# Patient Record
Sex: Male | Born: 1980 | Race: White | Hispanic: No | Smoking: Current every day smoker
Health system: Southern US, Community
[De-identification: ages and names within clinical notes are randomized; demographics above are authoritative.]

---

## 2009-04-14 ENCOUNTER — Emergency Department (HOSPITAL_COMMUNITY): Admission: EM | Admit: 2009-04-14 | Discharge: 2009-04-14 | Payer: Self-pay | Admitting: Emergency Medicine

## 2010-05-01 ENCOUNTER — Emergency Department (HOSPITAL_COMMUNITY): Admission: EM | Admit: 2010-05-01 | Discharge: 2010-05-01 | Payer: Self-pay | Admitting: Emergency Medicine

## 2011-11-25 ENCOUNTER — Encounter (HOSPITAL_COMMUNITY): Payer: Self-pay | Admitting: *Deleted

## 2011-11-25 ENCOUNTER — Emergency Department (HOSPITAL_COMMUNITY)
Admission: EM | Admit: 2011-11-25 | Discharge: 2011-11-25 | Disposition: A | Discharge status: Home / Self Care | Payer: Self-pay | Attending: Emergency Medicine | Admitting: Emergency Medicine

## 2011-11-25 ENCOUNTER — Emergency Department (HOSPITAL_COMMUNITY): Payer: Self-pay

## 2011-11-25 DIAGNOSIS — M79609 Pain in unspecified limb: Secondary | ICD-10-CM | POA: Insufficient documentation

## 2011-11-25 DIAGNOSIS — S0510XA Contusion of eyeball and orbital tissues, unspecified eye, initial encounter: Secondary | ICD-10-CM | POA: Insufficient documentation

## 2011-11-25 DIAGNOSIS — R404 Transient alteration of awareness: Secondary | ICD-10-CM | POA: Insufficient documentation

## 2011-11-25 DIAGNOSIS — R220 Localized swelling, mass and lump, head: Secondary | ICD-10-CM | POA: Insufficient documentation

## 2011-11-25 DIAGNOSIS — S8391XA Sprain of unspecified site of right knee, initial encounter: Secondary | ICD-10-CM

## 2011-11-25 DIAGNOSIS — M25569 Pain in unspecified knee: Secondary | ICD-10-CM | POA: Insufficient documentation

## 2011-11-25 DIAGNOSIS — S0083XA Contusion of other part of head, initial encounter: Secondary | ICD-10-CM

## 2011-11-25 DIAGNOSIS — IMO0002 Reserved for concepts with insufficient information to code with codable children: Secondary | ICD-10-CM | POA: Insufficient documentation

## 2011-11-25 DIAGNOSIS — S60229A Contusion of unspecified hand, initial encounter: Secondary | ICD-10-CM | POA: Insufficient documentation

## 2011-11-25 DIAGNOSIS — R6884 Jaw pain: Secondary | ICD-10-CM | POA: Insufficient documentation

## 2011-11-25 DIAGNOSIS — S0003XA Contusion of scalp, initial encounter: Secondary | ICD-10-CM | POA: Insufficient documentation

## 2011-11-25 DIAGNOSIS — M7989 Other specified soft tissue disorders: Secondary | ICD-10-CM | POA: Insufficient documentation

## 2011-11-25 MED ORDER — ONDANSETRON 4 MG PO TBDP
4.0000 mg | ORAL_TABLET | Freq: Once | ORAL | Status: AC
  Administered 2011-11-25: 4 mg via ORAL
  Filled 2011-11-25: qty 1

## 2011-11-25 MED ORDER — HYDROMORPHONE HCL PF 1 MG/ML IJ SOLN
1.0000 mg | Freq: Once | INTRAMUSCULAR | Status: AC
  Administered 2011-11-25: 1 mg via INTRAMUSCULAR
  Filled 2011-11-25: qty 1

## 2011-11-25 MED ORDER — HYDROCODONE-ACETAMINOPHEN 5-325 MG PO TABS: ORAL_TABLET | ORAL | Status: DC
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
## 2011-11-25 NOTE — ED Notes (Signed)
Rick Miller, PA at bedside. 
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
## 2011-11-25 NOTE — ED Notes (Signed)
Patient with no complaints at this time. Respirations even and unlabored. Skin warm/dry. Discharge instructions reviewed with patient at this time. Patient given opportunity to voice concerns/ask questions. Patient discharged at this time and left Emergency Department with steady gait.   
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
## 2011-11-29 NOTE — ED Provider Notes (Signed)
Medical screening examination/treatment/procedure(s) were performed by non-physician practitioner and as supervising physician I was immediately available for consultation/collaboration.   Damarion Mendizabal, MD 11/29/11 1335 

## 2012-07-21 ENCOUNTER — Encounter (HOSPITAL_COMMUNITY): Payer: Self-pay

## 2012-07-21 ENCOUNTER — Emergency Department (HOSPITAL_COMMUNITY)
Admission: EM | Admit: 2012-07-21 | Discharge: 2012-07-21 | Disposition: A | Discharge status: Home / Self Care | Payer: Self-pay | Attending: Emergency Medicine | Admitting: Emergency Medicine

## 2012-07-21 DIAGNOSIS — Z23 Encounter for immunization: Secondary | ICD-10-CM | POA: Insufficient documentation

## 2012-07-21 DIAGNOSIS — F17200 Nicotine dependence, unspecified, uncomplicated: Secondary | ICD-10-CM | POA: Insufficient documentation

## 2012-07-21 DIAGNOSIS — Y9389 Activity, other specified: Secondary | ICD-10-CM | POA: Insufficient documentation

## 2012-07-21 DIAGNOSIS — S61459A Open bite of unspecified hand, initial encounter: Secondary | ICD-10-CM

## 2012-07-21 DIAGNOSIS — W540XXA Bitten by dog, initial encounter: Secondary | ICD-10-CM | POA: Insufficient documentation

## 2012-07-21 DIAGNOSIS — Y92009 Unspecified place in unspecified non-institutional (private) residence as the place of occurrence of the external cause: Secondary | ICD-10-CM | POA: Insufficient documentation

## 2012-07-21 DIAGNOSIS — S61409A Unspecified open wound of unspecified hand, initial encounter: Secondary | ICD-10-CM | POA: Insufficient documentation

## 2012-07-21 MED ORDER — TETANUS-DIPHTH-ACELL PERTUSSIS 5-2.5-18.5 LF-MCG/0.5 IM SUSP
0.5000 mL | Freq: Once | INTRAMUSCULAR | Status: AC
  Administered 2012-07-21: 0.5 mL via INTRAMUSCULAR
  Filled 2012-07-21: qty 0.5

## 2012-07-21 MED ORDER — OXYCODONE-ACETAMINOPHEN 5-325 MG PO TABS: 1.0000 | ORAL_TABLET | ORAL | Status: DC | PRN

## 2012-07-21 MED ORDER — AMOXICILLIN-POT CLAVULANATE 875-125 MG PO TABS
1.0000 | ORAL_TABLET | Freq: Once | ORAL | Status: AC
  Administered 2012-07-21: 1 via ORAL
  Filled 2012-07-21: qty 1

## 2012-07-21 MED ORDER — AMOXICILLIN-POT CLAVULANATE 875-125 MG PO TABS: 1.0000 | ORAL_TABLET | Freq: Two times a day (BID) | ORAL | Status: DC

## 2012-07-21 MED ORDER — BACITRACIN 500 UNIT/GM EX OINT
1.0000 "application " | TOPICAL_OINTMENT | Freq: Two times a day (BID) | CUTANEOUS | Status: DC
  Administered 2012-07-21: 1 via TOPICAL
  Filled 2012-07-21 (×4): qty 0.9

## 2012-07-21 MED ORDER — OXYCODONE-ACETAMINOPHEN 5-325 MG PO TABS: 1.0000 | ORAL_TABLET | ORAL | Status: AC | PRN

## 2012-07-21 MED ORDER — BACITRACIN ZINC 500 UNIT/GM EX OINT
TOPICAL_OINTMENT | CUTANEOUS | Status: AC
  Filled 2012-07-21: qty 1.8

## 2012-07-21 NOTE — ED Notes (Signed)
[facility] at bedside with pt
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
## 2012-07-21 NOTE — ED Notes (Signed)
Pt alert & oriented x4, stable gait. Patient given discharge instructions, paperwork & prescription(s). Patient  instructed to stop at the registration desk to finish any additional paperwork. Patient verbalized understanding. Pt left department w/ no further questions. 
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
## 2012-07-22 NOTE — ED Provider Notes (Signed)
Medical screening examination/treatment/procedure(s) were performed by non-physician practitioner and as supervising physician I was immediately available for consultation/collaboration.   Benny Lennert, MD 07/22/12 [phone]

## 2012-07-23 ENCOUNTER — Emergency Department (HOSPITAL_COMMUNITY): Payer: Self-pay

## 2012-07-23 ENCOUNTER — Encounter (HOSPITAL_COMMUNITY): Payer: Self-pay | Admitting: *Deleted

## 2012-07-23 ENCOUNTER — Emergency Department (HOSPITAL_COMMUNITY)
Admission: EM | Admit: 2012-07-23 | Discharge: 2012-07-23 | Disposition: A | Discharge status: Home / Self Care | Payer: Self-pay | Attending: Emergency Medicine | Admitting: Emergency Medicine

## 2012-07-23 DIAGNOSIS — Z79899 Other long term (current) drug therapy: Secondary | ICD-10-CM | POA: Insufficient documentation

## 2012-07-23 DIAGNOSIS — Y939 Activity, unspecified: Secondary | ICD-10-CM | POA: Insufficient documentation

## 2012-07-23 DIAGNOSIS — W540XXA Bitten by dog, initial encounter: Secondary | ICD-10-CM | POA: Insufficient documentation

## 2012-07-23 DIAGNOSIS — F17200 Nicotine dependence, unspecified, uncomplicated: Secondary | ICD-10-CM | POA: Insufficient documentation

## 2012-07-23 DIAGNOSIS — L089 Local infection of the skin and subcutaneous tissue, unspecified: Secondary | ICD-10-CM

## 2012-07-23 DIAGNOSIS — S61409A Unspecified open wound of unspecified hand, initial encounter: Secondary | ICD-10-CM | POA: Insufficient documentation

## 2012-07-23 DIAGNOSIS — Y929 Unspecified place or not applicable: Secondary | ICD-10-CM | POA: Insufficient documentation

## 2012-07-23 LAB — COMPREHENSIVE METABOLIC PANEL
ALT: 12 U/L (ref 0–53)
AST: 14 U/L (ref 0–37)
Albumin: 3.3 g/dL — ABNORMAL LOW (ref 3.5–5.2)
BUN: 16 mg/dL (ref 6–23)
Calcium: 8.9 mg/dL (ref 8.4–10.5)
Creatinine, Ser: 0.99 mg/dL (ref 0.50–1.35)

## 2012-07-23 LAB — CBC WITH DIFFERENTIAL/PLATELET
Basophils Absolute: 0 10*3/uL (ref 0.0–0.1)
Basophils Relative: 0 % (ref 0–1)
HCT: 41 % (ref 39.0–52.0)
Hemoglobin: 13.9 g/dL (ref 13.0–17.0)
MCH: 30.2 pg (ref 26.0–34.0)
MCHC: 33.9 g/dL (ref 30.0–36.0)
Neutro Abs: 6.2 10*3/uL (ref 1.7–7.7)
Platelets: 182 10*3/uL (ref 150–400)
RDW: 12.5 % (ref 11.5–15.5)

## 2012-07-23 MED ORDER — MORPHINE SULFATE 4 MG/ML IJ SOLN
4.0000 mg | Freq: Once | INTRAMUSCULAR | Status: AC
  Administered 2012-07-23: 4 mg via INTRAVENOUS
  Filled 2012-07-23: qty 1

## 2012-07-23 MED ORDER — CEFTRIAXONE SODIUM 1 G IV SOLR: 1.0000 g | INTRAVENOUS | Status: AC

## 2012-07-23 MED ORDER — METRONIDAZOLE 500 MG PO TABS: 500.0000 mg | ORAL_TABLET | Freq: Three times a day (TID) | ORAL | Status: DC

## 2012-07-23 MED ORDER — METRONIDAZOLE IN NACL 5-0.79 MG/ML-% IV SOLN
500.0000 mg | Freq: Once | INTRAVENOUS | Status: AC
  Administered 2012-07-23: 500 mg via INTRAVENOUS
  Filled 2012-07-23: qty 100

## 2012-07-23 MED ORDER — SODIUM CHLORIDE 0.9 % IV SOLN
INTRAVENOUS | Status: DC
  Administered 2012-07-23: 16:00:00 via INTRAVENOUS

## 2012-07-23 MED ORDER — DEXTROSE 5 % IV SOLN
1.0000 g | Freq: Once | INTRAVENOUS | Status: AC
  Administered 2012-07-23: 1 g via INTRAVENOUS
  Filled 2012-07-23: qty 10

## 2012-07-23 MED ORDER — ONDANSETRON HCL 4 MG/2ML IJ SOLN
4.0000 mg | Freq: Once | INTRAMUSCULAR | Status: AC
  Administered 2012-07-23: 4 mg via INTRAVENOUS
  Filled 2012-07-23: qty 2

## 2012-07-23 NOTE — ED Notes (Signed)
Patient cursing at staff and stating he was going to leave andf that he was here at 2 and still he was here.
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
## 2012-07-23 NOTE — ED Notes (Signed)
Patient states he is ready to be discharged at this time.

## 2012-07-23 NOTE — ED Notes (Addendum)
For recheck of dog bite to lt hand.  Swelling and pain present.  Swelling of middle finger especially.

## 2012-07-25 ENCOUNTER — Encounter (HOSPITAL_COMMUNITY): Payer: Self-pay | Admitting: *Deleted

## 2012-07-25 ENCOUNTER — Emergency Department (HOSPITAL_COMMUNITY)
Admission: EM | Admit: 2012-07-25 | Discharge: 2012-07-25 | Disposition: A | Discharge status: Home / Self Care | Payer: Self-pay | Attending: Emergency Medicine | Admitting: Emergency Medicine

## 2012-07-25 DIAGNOSIS — L089 Local infection of the skin and subcutaneous tissue, unspecified: Secondary | ICD-10-CM

## 2012-07-25 DIAGNOSIS — M79609 Pain in unspecified limb: Secondary | ICD-10-CM | POA: Insufficient documentation

## 2012-07-25 DIAGNOSIS — F17200 Nicotine dependence, unspecified, uncomplicated: Secondary | ICD-10-CM | POA: Insufficient documentation

## 2012-07-25 DIAGNOSIS — Y929 Unspecified place or not applicable: Secondary | ICD-10-CM | POA: Insufficient documentation

## 2012-07-25 DIAGNOSIS — S61409A Unspecified open wound of unspecified hand, initial encounter: Secondary | ICD-10-CM | POA: Insufficient documentation

## 2012-07-25 DIAGNOSIS — Z87828 Personal history of other (healed) physical injury and trauma: Secondary | ICD-10-CM | POA: Insufficient documentation

## 2012-07-25 DIAGNOSIS — S61459A Open bite of unspecified hand, initial encounter: Secondary | ICD-10-CM

## 2012-07-25 DIAGNOSIS — M25539 Pain in unspecified wrist: Secondary | ICD-10-CM | POA: Insufficient documentation

## 2012-07-25 DIAGNOSIS — Y9389 Activity, other specified: Secondary | ICD-10-CM | POA: Insufficient documentation

## 2012-07-25 DIAGNOSIS — W540XXA Bitten by dog, initial encounter: Secondary | ICD-10-CM | POA: Insufficient documentation

## 2012-07-25 MED ORDER — HYDROCODONE-ACETAMINOPHEN 5-325 MG PO TABS: ORAL_TABLET | ORAL | Status: DC

## 2012-07-25 MED ORDER — OXYCODONE-ACETAMINOPHEN 5-325 MG PO TABS
1.0000 | ORAL_TABLET | Freq: Once | ORAL | Status: AC
  Administered 2012-07-25: 1 via ORAL
  Filled 2012-07-25: qty 1

## 2012-07-25 MED ORDER — DEXTROSE 5 % IV SOLN
1.0000 g | Freq: Once | INTRAVENOUS | Status: AC
  Administered 2012-07-25: 1 g via INTRAVENOUS
  Filled 2012-07-25: qty 1

## 2012-07-25 NOTE — ED Provider Notes (Addendum)
History  This chart was scribed for Ward Givens, MD by Madaline Brilliant, ED Scribe. The patient was seen in room APA11/[facility]11. Patient's care was started at 1029.   CSN: [id number]  Arrival date & time 07/25/12  1002   First MD Initiated Contact with Patient 07/25/12 1029      Chief Complaint  Patient presents with  . Wound Check    The history is provided by the patient. No language interpreter was used.   Trevor Hodges is a 32 y.o. male presents to the Emergency Department complaining of moderate, worsening left-hand pain between his ring finger and middle finger after a dog bite  4-5 days ago that was treated with augmentin. Pt was seen in ED 2 days ago for infection in the  dog bite to left hand and was given IV antibiotics (rocephin) and a prescription for flagyl. He was discussed with Dr Romeo Apple. Patient was to return the following day to get IV antibiotics in the specialty clinic and was to see Dr. Romeo Apple in the ED today for recheck. He relates when he went to the specialty clinic yesterday they arranged for him to get home health antibiotic treatment. He states it was delivered last night at 10 PM. He has not gotten the Flagyl filled yet. They state they're going to do it today. So basically patient has had no antibiotics since he was seen in the ED 2 days ago except for the Augmentin he was artery on that was not improving his symptoms. He states that pain today in his left hand is worse than previous pain. Pt has not had any abx since discharge from ED 2 days ago. Pt reports that at baseline cannot straighten the end of his middle finger due to prior dislocation.   PCP None   History reviewed. No pertinent past medical history.  History reviewed. No pertinent past surgical history.  No family history on file.  History  Substance Use Topics  . Smoking status: Current Every Day Smoker  . Smokeless tobacco: Not on file  . Alcohol Use: Yes     Comment: occassional     Unemployed Lives at home Lives with spouse   Review of Systems  Musculoskeletal: Positive for arthralgias (left ring finger and middle finger).  All other systems reviewed and are negative.    Allergies  Toradol and Ultram  Home Medications   Current Outpatient Rx  Name  Route  Sig  Dispense  Refill  . AMOXICILLIN-POT CLAVULANATE 875-125 MG PO TABS   Oral   Take 1 tablet by mouth 2 (two) times daily.   20 tablet   0   . GOODY HEADACHE PO   Oral   Take 1 Package by mouth daily as needed. Headache.         . CEFTRIAXONE SODIUM 1 G IV SOLR   Intravenous   Inject 1 g into the vein daily.   10 each   0     TO GET IN SPECIALTY CLINIC AT [facility] DAILY   . METRONIDAZOLE 500 MG PO TABS   Oral   Take 1 tablet (500 mg total) by mouth 3 (three) times daily.   30 tablet   0   . OXYCODONE-ACETAMINOPHEN 5-325 MG PO TABS   Oral   Take 1 tablet by mouth every 4 (four) hours as needed for pain.   6 tablet   0     Dispense one pre-pack for home use     Triage  vitals:  BP 118/65  Pulse 81  Temp 97.7 F (36.5 C) (Oral)  Resp 18  Ht 6' (1.829 m)  Wt 185 lb (83.915 kg)  BMI 25.09 kg/m2  SpO2 98%  Vital signs normal    Physical Exam  Nursing note and vitals reviewed. Constitutional: He is oriented to person, place, and time. He appears well-developed and well-nourished.  Non-toxic appearance. He does not appear ill. No distress.  HENT:  Head: Normocephalic and atraumatic.  Right Ear: External ear normal.  Left Ear: External ear normal.  Nose: Nose normal. No mucosal edema or rhinorrhea.  Mouth/Throat: Oropharynx is clear and moist and mucous membranes are normal. No dental abscesses or uvula swelling.  Eyes: Conjunctivae normal and EOM are normal. Pupils are equal, round, and reactive to light.  Neck: Normal range of motion and full passive range of motion without pain. Neck supple.  Cardiovascular: Normal rate, regular rhythm and normal heart sounds.  Exam  reveals no gallop and no friction rub.   No murmur heard. Pulmonary/Chest: Effort normal and breath sounds normal. No respiratory distress. He has no wheezes. He has no rhonchi. He has no rales. He exhibits no tenderness and no crepitus.  Abdominal: Soft. Normal appearance and bowel sounds are normal. He exhibits no distension. There is no tenderness. There is no rebound and no guarding.  Musculoskeletal: Normal range of motion. He exhibits no edema and no tenderness.          Laceration on dorsum of hand between metacarpals of  ring and middle finger with increased redness from last visit. There is more swelling of dorsum of left hand up to the wrist and mild red streaking of radial aspect of the  Forearm. Has intact extension, can flex but limited b/o swelling.   Puncture wound on left palm that is 1 cm proximal to left index finger MCP crease. Superficial abrasions of the left index finger over radial aspect of PIP joint. Linear abrasion over MCP joint of left index. There are a  couple of superficial abrasions of web space between thumb and index fingers of left hand. Small abrasion radial to MCP of left index finger.  There is a semicirvular laceration proximal to space between MCP of ring and middle finger with small puncture abrasions (about 4) proximal to it. Diffuse swelling and redness to dorsum of left hand. Swelling of left middle, ring, and index fingers. Pt has difficulty making a fist mainly from the swelling in the dorsum of his left hand. Has minimal discomfort to extension of his fingers.  Palm of right hand has linear abrasion in web space between thumb and index finger. Small single puncture wound ulnar to proximal metatarsal of right thumb.     Neurological: He is alert and oriented to person, place, and time. He has normal strength. No cranial nerve deficit.  Skin: Skin is warm, dry and intact. No rash noted. No erythema. No pallor.  Psychiatric: He has a normal mood and  affect. His speech is normal and behavior is normal. His mood appears not anxious.    ED Course  Procedures (including critical care time)   Medications  cefTAZidime (FORTAZ) 1 g in dextrose 5 % 50 mL IVPB (0 g Intravenous Stopped 07/25/12 1244)  Ordered by Dr Romeo Apple  DIAGNOSTIC STUDIES: Oxygen Saturation is 98% on room, normal by my interpretation.    COORDINATION OF CARE: 1058 Schedule of antibiotics is being reviewed. Patient informed of current plan for treatment and evaluation and  agrees with plan at this time. :  11:10 Dr Romeo Apple will see in the ED  Dr Romeo Apple has seen patient in the ED  1139 Recheck: Pt is ready for discharge    1. Dog bite of hand   2. Infected hand      New Prescriptions   HYDROCODONE-ACETAMINOPHEN (NORCO/VICODIN) 5-325 MG PER TABLET    Take 1 or 2 po Q 6hrs for pain   Plan discharge  Devoria Albe, MD, FACEP      MDM    I personally performed the services described in this documentation, which was scribed in my presence. The recorded information has been reviewed and considered.  Devoria Albe, MD, FACEP       Ward Givens, MD 07/25/12 1401  Ward Givens, MD 07/25/12 [phone]

## 2012-07-25 NOTE — Progress Notes (Addendum)
Patient ID: Trevor Hodges, male   DOB: 01-10-1981, 32 y.o.   MRN: [id number] Left hand cellulitis  DID NOT GET ANTIBIOTIC LAST 2 DAYS   MEDICAL STAFF UNAWARE [facility] IS HANDLING OUT PATIENT IV   CALLED [facility] TO CONFIRM IV ROCEPHIN FOR THIS PATIENT X 10 DAYS   DORSAL HAND SWELLING SOME ERYTHEMA, PAIN, TENDERNESS,  NO ABSCESS.  REC IV ROCEPHIN [HE HAS SOME ORAL MEDS ?????]  F/U IN 48 HRS IN ER. THEN WITH DR HARRISON ON Thursday AT 130 PM

## 2012-07-25 NOTE — ED Notes (Signed)
C/o left hand pain, worsening since onset 4 days ago; out of pain medication.

## 2012-07-28 MED FILL — Oxycodone w/ Acetaminophen Tab 5-325 MG: ORAL | Qty: 6 | Status: AC

## 2012-07-31 ENCOUNTER — Ambulatory Visit (INDEPENDENT_AMBULATORY_CARE_PROVIDER_SITE_OTHER): Payer: Self-pay | Admitting: Orthopedic Surgery

## 2012-07-31 ENCOUNTER — Encounter: Payer: Self-pay | Admitting: Orthopedic Surgery

## 2012-07-31 VITALS — BP 110/78 | Ht 72.0 in | Wt 185.0 lb

## 2012-07-31 DIAGNOSIS — S61409A Unspecified open wound of unspecified hand, initial encounter: Secondary | ICD-10-CM

## 2012-07-31 DIAGNOSIS — S61459A Open bite of unspecified hand, initial encounter: Secondary | ICD-10-CM | POA: Insufficient documentation

## 2012-07-31 DIAGNOSIS — W540XXA Bitten by dog, initial encounter: Secondary | ICD-10-CM

## 2012-07-31 NOTE — Progress Notes (Signed)
Patient ID: Trevor Hodges, male   DOB: 04/12/1981, 32 y.o.   MRN: [id number] Chief Complaint  Patient presents with  . Hand Problem    Dog bite left hand   Right-handed 32 year old male was bitten by a dog over a week ago he was seen in emergency room on 2 occasions and eventually started on IV antibiotics with Rocephin once a day. Over the last 7 days of IV antibiotics his swelling has gone down his pain is increased in his range of motion is gotten better he complains of mild discomfort over his left hand primarily involving the ring finger. He has 2 lacerations one in between the ring and long finger and one closer to the index finger neither of them are over the joint there is no erythema swelling loss of motion or streaking  Review of systems cough nausea  Medical history as described  Exam   VS BP 110/78  Ht 6' (1.829 m)  Wt 185 lb (83.915 kg)  BMI 25.09 kg/m2   General appearance is normal grooming and hygiene  Oriented x3  Mood and affect is normal  Cardiovascular exam is normal  Sensation is normal  Coordination and balance normal  No pathologic reflexes  Ambulation  is without support or limp  Inspection and palpation  left hand has 2 lacerations as described they are healing there is no swelling range of motion is full stability of the joints are normal grip strength is slightly diminished from the injury and pain  Medical decision making  Continue IV antibiotics for dog bite with hand infection  Followup in one week continue current treatment

## 2012-07-31 NOTE — Patient Instructions (Signed)
Continue iv antibiotics

## 2012-08-07 ENCOUNTER — Encounter: Payer: Self-pay | Admitting: Orthopedic Surgery

## 2012-08-07 ENCOUNTER — Ambulatory Visit: Payer: Self-pay | Admitting: Orthopedic Surgery

## 2012-09-10 ENCOUNTER — Encounter (HOSPITAL_COMMUNITY): Payer: Self-pay

## 2012-09-10 ENCOUNTER — Emergency Department (HOSPITAL_COMMUNITY)
Admission: EM | Admit: 2012-09-10 | Discharge: 2012-09-10 | Disposition: A | Discharge status: Home / Self Care | Payer: Self-pay | Attending: Emergency Medicine | Admitting: Emergency Medicine

## 2012-09-10 ENCOUNTER — Emergency Department (HOSPITAL_COMMUNITY): Payer: Self-pay

## 2012-09-10 DIAGNOSIS — W010XXA Fall on same level from slipping, tripping and stumbling without subsequent striking against object, initial encounter: Secondary | ICD-10-CM | POA: Insufficient documentation

## 2012-09-10 DIAGNOSIS — Y9289 Other specified places as the place of occurrence of the external cause: Secondary | ICD-10-CM | POA: Insufficient documentation

## 2012-09-10 DIAGNOSIS — M25561 Pain in right knee: Secondary | ICD-10-CM

## 2012-09-10 DIAGNOSIS — F17200 Nicotine dependence, unspecified, uncomplicated: Secondary | ICD-10-CM | POA: Insufficient documentation

## 2012-09-10 DIAGNOSIS — S8990XA Unspecified injury of unspecified lower leg, initial encounter: Secondary | ICD-10-CM | POA: Insufficient documentation

## 2012-09-10 DIAGNOSIS — S99919A Unspecified injury of unspecified ankle, initial encounter: Secondary | ICD-10-CM | POA: Insufficient documentation

## 2012-09-10 DIAGNOSIS — Y939 Activity, unspecified: Secondary | ICD-10-CM | POA: Insufficient documentation

## 2012-09-10 DIAGNOSIS — G8929 Other chronic pain: Secondary | ICD-10-CM | POA: Insufficient documentation

## 2012-09-10 MED ORDER — HYDROCODONE-ACETAMINOPHEN 5-325 MG PO TABS: 1.0000 | ORAL_TABLET | ORAL | Status: DC | PRN

## 2012-09-10 NOTE — ED Notes (Signed)
Pt reports has chronic r knee pain from injury years ago.  Reports pain has been more intense recently.

## 2012-09-10 NOTE — ED Notes (Signed)
Pt presents with rt knee pain, states has  chronic knee pain, with worsening pain since yesterday. NAD noted.

## 2012-09-10 NOTE — ED Notes (Signed)
Pt presents to Ed secondary to chronic knee pain, worsening "today after a fall on the ice".  No swelling, bruising, abrasion or deformity noted. Pt is noted slurring his words, has excessive oral secretions at the corners of his mouth and dilated pupils. Pt kept repeating he did not want any trouble, just wants his knee fixed. NAD noted.

## 2012-09-11 NOTE — ED Provider Notes (Signed)
History     CSN: [id number]  Arrival date & time 09/10/12  1126   First MD Initiated Contact with Patient 09/10/12 1137      Chief Complaint  Patient presents with  . Knee Pain    (Consider location/radiation/quality/duration/timing/severity/associated sxs/prior treatment) HPI Comments: Trevor Hodges is a 32 y.o. Male presenting with acute on chronic right knee pain after tripping this morning, and hitting his knee against the edge of a step.  He has chronic but slowly worsening pain in this knee since he had a fall about 5 years ago which caused acute knee trauma and large laceration to the inferior anterior knee.  At the time,  He was referred to orthopedics but never followed up with the specialist.  He denies weakness in the knee and there is no radiation of pain and no numbness or weakness distal to the injury site.  He has taken no medicines prior to arrival today.  Pain is worse with palpation, flexing the knee and weight bearing,  Although he was able to ambulate upon arrival.     The history is provided by the patient.    History reviewed. No pertinent past medical history.  History reviewed. No pertinent past surgical history.  No family history on file.  History  Substance Use Topics  . Smoking status: Current Every Day Smoker  . Smokeless tobacco: Not on file  . Alcohol Use: Yes     Comment: occassional       Review of Systems  Musculoskeletal: Positive for joint swelling and arthralgias.  Skin: Negative for wound.  Neurological: Negative for weakness and numbness.    Allergies  Toradol and Ultram  Home Medications   Current Outpatient Rx  Name  Route  Sig  Dispense  Refill  . HYDROcodone-acetaminophen (NORCO/VICODIN) 5-325 MG per tablet   Oral   Take 1 tablet by mouth every 4 (four) hours as needed for pain.   15 tablet   0     BP 121/71  Pulse 91  Temp(Src) 97.7 F (36.5 C) (Oral)  Resp 18  Ht 6' (1.829 m)  Wt 180 lb (81.647 kg)  BMI  24.41 kg/m2  SpO2 99%  Physical Exam  Constitutional: He appears well-developed and well-nourished.  HENT:  Head: Atraumatic.  Neck: Normal range of motion.  Cardiovascular:  Pulses equal bilaterally  Musculoskeletal: He exhibits tenderness. He exhibits no edema.       Right knee: He exhibits decreased range of motion and swelling. He exhibits no effusion, no ecchymosis, no deformity, no erythema, no LCL laxity, normal patellar mobility and no MCL laxity. Tenderness found. Medial joint line and lateral joint line tenderness noted.  TTP along medial and lateral anterior tibial plateau,  Modest crepitus with ROM.  No effusion,  No edema appreciated.  He does have a small,  Mobile subcutaneous hard nodule lateral knee joint space (states it's a retained rock from previous injury).  Neurological: He is alert. He has normal strength. He displays normal reflexes. No sensory deficit.  Equal strength  Skin: Skin is warm and dry.  Psychiatric: He has a normal mood and affect.    ED Course  Procedures (including critical care time)  Labs Reviewed - No data to display Dg Knee Complete 4 Views Right  09/10/2012  *RADIOLOGY REPORT*  Clinical Data: Worsening chronic right knee pain post fall  RIGHT KNEE - COMPLETE 4+ VIEW  Comparison: 11/25/2011  Findings: Small calcified ossicle lateral to the distal tibial metaphysis  unchanged. Osseous mineralization normal. Joint spaces preserved. No acute fracture, dislocation, or bone destruction. No knee joint effusion.  IMPRESSION: No acute osseous abnormalities. No interval change.   Original Report Authenticated By: Ulyses Southward, M.D.      1. Chronic knee pain, right       MDM  Acute on chronic right knee pain with no acute findings on exam and per xray,  Which was reviewed.  Ace wrap provided,  Crutches offered but refused.  Hydrocodone short course supplied. Ice recommended for the next 2 days (ice pack also given in ed).  Referral to ortho for definitive  tx of acute on chronic knee pain.  [place] controlled substance database reviewed with no pattern of abuse found.   The patient appears reasonably screened and/or stabilized for discharge and I doubt any other medical condition or other [facility] requiring further screening, evaluation, or treatment in the ED at this time prior to discharge.         Burgess Amor, PA-C 09/11/12 1140  Burgess Amor, PA-C 09/11/12 1141

## 2012-09-13 NOTE — ED Provider Notes (Signed)
History/physical exam/procedure(s) were performed by non-physician practitioner and as supervising physician I was immediately available for consultation/collaboration. I have reviewed all notes and am in agreement with care and plan.   Hilario Quarry, MD 09/13/12 [phone]

## 2012-10-08 ENCOUNTER — Emergency Department (HOSPITAL_COMMUNITY)
Admission: EM | Admit: 2012-10-08 | Discharge: 2012-10-08 | Disposition: A | Discharge status: Home / Self Care | Payer: Self-pay | Attending: Emergency Medicine | Admitting: Emergency Medicine

## 2012-10-08 ENCOUNTER — Encounter (HOSPITAL_COMMUNITY): Payer: Self-pay | Admitting: Emergency Medicine

## 2012-10-08 ENCOUNTER — Emergency Department (HOSPITAL_COMMUNITY): Payer: Self-pay

## 2012-10-08 DIAGNOSIS — W11XXXA Fall on and from ladder, initial encounter: Secondary | ICD-10-CM | POA: Insufficient documentation

## 2012-10-08 DIAGNOSIS — Y9389 Activity, other specified: Secondary | ICD-10-CM | POA: Insufficient documentation

## 2012-10-08 DIAGNOSIS — S86911A Strain of unspecified muscle(s) and tendon(s) at lower leg level, right leg, initial encounter: Secondary | ICD-10-CM

## 2012-10-08 DIAGNOSIS — IMO0002 Reserved for concepts with insufficient information to code with codable children: Secondary | ICD-10-CM | POA: Insufficient documentation

## 2012-10-08 DIAGNOSIS — Y92009 Unspecified place in unspecified non-institutional (private) residence as the place of occurrence of the external cause: Secondary | ICD-10-CM | POA: Insufficient documentation

## 2012-10-08 DIAGNOSIS — F17200 Nicotine dependence, unspecified, uncomplicated: Secondary | ICD-10-CM | POA: Insufficient documentation

## 2012-10-08 MED ORDER — MELOXICAM 7.5 MG PO TABS: 7.5000 mg | ORAL_TABLET | Freq: Every day | ORAL | Status: DC

## 2012-10-08 NOTE — ED Notes (Signed)
Jumped off ladder and felt knee impact with severe pain

## 2012-10-08 NOTE — ED Provider Notes (Signed)
History     CSN: [id number]  Arrival date & time 10/08/12  1343   First MD Initiated Contact with Patient 10/08/12 1359      Chief Complaint  Patient presents with  . Knee Pain    (Consider location/radiation/quality/duration/timing/severity/associated sxs/prior treatment) HPI Comments: Trevor Hodges is a 32 y.o. Male presenting with right knee pain after he jumped off a ladder from the 8th rung as it was tipping,  He landed on his feet but then fell to the side, landing on his right lateral knee.  He has chronic pain in the knee at baseline secondary to injury when he was a teenager with a retained piece of stone along the lateral knee which intermittently causes pain.  He has taken no medicines before arrival here.  He is wearing a knee brace which he reports wearing often when working.  There is no radiation of pain which is constant and aching. Movement and palpation makes worse.    The history is provided by the patient.    History reviewed. No pertinent past medical history.  History reviewed. No pertinent past surgical history.  History reviewed. No pertinent family history.  History  Substance Use Topics  . Smoking status: Current Every Day Smoker  . Smokeless tobacco: Not on file  . Alcohol Use: Yes     Comment: occassional       Review of Systems  Constitutional: Negative for fever.  Musculoskeletal: Positive for joint swelling and arthralgias. Negative for myalgias.  Neurological: Negative for weakness and numbness.    Allergies  Toradol and Ultram  Home Medications   Current Outpatient Rx  Name  Route  Sig  Dispense  Refill  . ibuprofen (ADVIL,MOTRIN) 200 MG tablet   Oral   Take 400 mg by mouth 2 (two) times daily as needed for pain.         . meloxicam (MOBIC) 7.5 MG tablet   Oral   Take 1 tablet (7.5 mg total) by mouth daily.   15 tablet   0     BP 115/61  Pulse 94  Temp(Src) 98.2 F (36.8 C) (Oral)  Resp 18  Ht 6' (1.829 m)  Wt  180 lb (81.647 kg)  BMI 24.41 kg/m2  SpO2 98%  Physical Exam  Constitutional: He appears well-developed and well-nourished.  HENT:  Head: Atraumatic.  Neck: Normal range of motion.  Cardiovascular:  Pulses equal bilaterally  Musculoskeletal: He exhibits tenderness. He exhibits no edema.       Right knee: He exhibits no swelling, no effusion, no deformity, normal alignment, no LCL laxity, normal meniscus and no MCL laxity. Tenderness found. Medial joint line and lateral joint line tenderness noted.  Old well healed laceration scar across inferior patella.  Small mobile fb right lateral knee space,  Non tender.    Neurological: He is alert. He has normal strength. He displays normal reflexes. No sensory deficit.  Equal strength  Skin: Skin is warm and dry.  Psychiatric: He has a normal mood and affect.    ED Course  Procedures (including critical care time)  Labs Reviewed - No data to display Dg Knee Complete 4 Views Right  10/08/2012  *RADIOLOGY REPORT*  Clinical Data: Injured right knee.  RIGHT KNEE - COMPLETE 4+ VIEW  Comparison: 09/10/2012.  Findings: The joint spaces are maintained.  No acute fractures identified.  No osteochondral lesion.  No joint effusion.  Stable calcification noted just lateral to the patella.  IMPRESSION: No acute bony  findings.   Original Report Authenticated By: Rudie Meyer, M.D.      1. Knee strain, right, initial encounter       MDM  Stable knee exam without physical or xray evidence of new injury.  Pt was placed in knee immobilizer and crutches provided. Encouraged ice,  Elevation,  Prescribed meloxicam.  Referral to Dr. Romeo Apple.    This pt was seen by me also on 09/10/12 for another injury to the same knee. He did not followup with the referrals given at that time.        Burgess Amor, PA-C 10/08/12 1634

## 2012-10-12 NOTE — ED Provider Notes (Signed)
Medical screening examination/treatment/procedure(s) were performed by non-physician practitioner and as supervising physician I was immediately available for consultation/collaboration. Tsuruko Murtha, MD, FACEP   Shadawn Hanaway L Naliyah Neth, MD 10/12/12 2244 

## 2012-10-30 ENCOUNTER — Emergency Department (HOSPITAL_COMMUNITY)
Admission: EM | Admit: 2012-10-30 | Discharge: 2012-10-30 | Disposition: A | Discharge status: Home / Self Care | Payer: Self-pay | Attending: Emergency Medicine | Admitting: Emergency Medicine

## 2012-10-30 ENCOUNTER — Encounter (HOSPITAL_COMMUNITY): Payer: Self-pay | Admitting: *Deleted

## 2012-10-30 DIAGNOSIS — Y9289 Other specified places as the place of occurrence of the external cause: Secondary | ICD-10-CM | POA: Insufficient documentation

## 2012-10-30 DIAGNOSIS — Y990 Civilian activity done for income or pay: Secondary | ICD-10-CM | POA: Insufficient documentation

## 2012-10-30 DIAGNOSIS — S81009A Unspecified open wound, unspecified knee, initial encounter: Secondary | ICD-10-CM | POA: Insufficient documentation

## 2012-10-30 DIAGNOSIS — W268XXA Contact with other sharp object(s), not elsewhere classified, initial encounter: Secondary | ICD-10-CM | POA: Insufficient documentation

## 2012-10-30 DIAGNOSIS — F17200 Nicotine dependence, unspecified, uncomplicated: Secondary | ICD-10-CM | POA: Insufficient documentation

## 2012-10-30 DIAGNOSIS — Z87828 Personal history of other (healed) physical injury and trauma: Secondary | ICD-10-CM | POA: Insufficient documentation

## 2012-10-30 DIAGNOSIS — S91009A Unspecified open wound, unspecified ankle, initial encounter: Secondary | ICD-10-CM | POA: Insufficient documentation

## 2012-10-30 DIAGNOSIS — S81812A Laceration without foreign body, left lower leg, initial encounter: Secondary | ICD-10-CM

## 2012-10-30 DIAGNOSIS — Y9389 Activity, other specified: Secondary | ICD-10-CM | POA: Insufficient documentation

## 2012-10-30 MED ORDER — LIDOCAINE-EPINEPHRINE (PF) 1 %-1:200000 IJ SOLN
INTRAMUSCULAR | Status: AC
  Filled 2012-10-30: qty 10
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
## 2012-12-21 ENCOUNTER — Emergency Department (HOSPITAL_COMMUNITY)
Admission: EM | Admit: 2012-12-21 | Discharge: 2012-12-21 | Disposition: A | Discharge status: Home / Self Care | Payer: Medicaid Other | Attending: Emergency Medicine | Admitting: Emergency Medicine

## 2012-12-21 ENCOUNTER — Encounter (HOSPITAL_COMMUNITY): Payer: Self-pay

## 2012-12-21 ENCOUNTER — Emergency Department (HOSPITAL_COMMUNITY): Payer: Medicaid Other

## 2012-12-21 DIAGNOSIS — S93402A Sprain of unspecified ligament of left ankle, initial encounter: Secondary | ICD-10-CM

## 2012-12-21 DIAGNOSIS — X500XXA Overexertion from strenuous movement or load, initial encounter: Secondary | ICD-10-CM | POA: Insufficient documentation

## 2012-12-21 DIAGNOSIS — Y929 Unspecified place or not applicable: Secondary | ICD-10-CM | POA: Insufficient documentation

## 2012-12-21 DIAGNOSIS — S93409A Sprain of unspecified ligament of unspecified ankle, initial encounter: Secondary | ICD-10-CM | POA: Insufficient documentation

## 2012-12-21 DIAGNOSIS — Y9389 Activity, other specified: Secondary | ICD-10-CM | POA: Insufficient documentation

## 2012-12-21 DIAGNOSIS — F17200 Nicotine dependence, unspecified, uncomplicated: Secondary | ICD-10-CM | POA: Insufficient documentation

## 2012-12-21 MED ORDER — ACETAMINOPHEN-CODEINE #3 300-30 MG PO TABS
1.0000 | ORAL_TABLET | Freq: Once | ORAL | Status: AC
  Administered 2012-12-21: 1 via ORAL
  Filled 2012-12-21: qty 1

## 2012-12-21 MED ORDER — IBUPROFEN 800 MG PO TABS: 800.0000 mg | ORAL_TABLET | Freq: Three times a day (TID) | ORAL | Status: DC

## 2012-12-21 MED ORDER — ACETAMINOPHEN-CODEINE #3 300-30 MG PO TABS: 1.0000 | ORAL_TABLET | Freq: Four times a day (QID) | ORAL | Status: DC | PRN

## 2012-12-21 MED ORDER — IBUPROFEN 800 MG PO TABS
800.0000 mg | ORAL_TABLET | Freq: Once | ORAL | Status: AC
  Administered 2012-12-21: 800 mg via ORAL
  Filled 2012-12-21: qty 1
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
## 2012-12-23 NOTE — ED Provider Notes (Signed)
Medical screening examination/treatment/procedure(s) were performed by non-physician practitioner and as supervising physician I was immediately available for consultation/collaboration.   Joya Gaskins, MD 12/23/12 [phone]

## 2013-09-02 ENCOUNTER — Ambulatory Visit (INDEPENDENT_AMBULATORY_CARE_PROVIDER_SITE_OTHER): Payer: Medicaid Other | Admitting: Orthopedic Surgery

## 2013-09-02 ENCOUNTER — Ambulatory Visit (INDEPENDENT_AMBULATORY_CARE_PROVIDER_SITE_OTHER): Payer: Medicaid Other

## 2013-09-02 ENCOUNTER — Encounter: Payer: Self-pay | Admitting: Orthopedic Surgery

## 2013-09-02 VITALS — BP 140/82 | Ht 72.0 in | Wt 171.0 lb

## 2013-09-02 DIAGNOSIS — M25569 Pain in unspecified knee: Secondary | ICD-10-CM

## 2013-09-02 DIAGNOSIS — M25561 Pain in right knee: Secondary | ICD-10-CM

## 2013-09-02 DIAGNOSIS — M2241 Chondromalacia patellae, right knee: Secondary | ICD-10-CM | POA: Insufficient documentation

## 2013-09-02 DIAGNOSIS — M2240 Chondromalacia patellae, unspecified knee: Secondary | ICD-10-CM
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
## 2013-09-02 NOTE — Patient Instructions (Addendum)
Chondromalacia Your exam shows your knee pain is likely due to a cartilage swelling and irritation under the knee cap called chondromalacia. The knee cap moves up and down in its groove when you walk, run, or squat. It can become irritated from sports or work activities if the knee cap is not lined up perfectly or your quadriceps muscle is relatively weak. This can cause pain, usually around the knee cap but sometimes the back of the knee. It is most common in young and active people. Climbing stairs, prolonged sitting and rising from a chair will often make the pain worse. Treatment includes rest from activities which make it worse. The pain can be reduced with ice packs and anti-inflammatory pain medicine. Exercises to strengthen the thigh (quadriceps) muscle may help prevent further episodes of this condition. Shoe inserts to correct imbalances in the legs or feet may be prescribed by your doctor or a specialist. Support for the knee cap with a light brace may also be helpful. Call your caregiver if you are not improving after 2 - 3 weeks of treatment.  SEEK MEDICAL CARE IF:  You have increasing pain or your knee becomes hot, swollen, red, or begins to give out or lock up on you. Document Released: 08/02/2004 Document Revised: 09/17/2011 Document Reviewed: 12/21/2008 [facility]Exit[facility] Patient Information 2014 [place]Care, [place]LLC.   [facility]

## 2013-11-04 ENCOUNTER — Ambulatory Visit (HOSPITAL_COMMUNITY)
Admission: RE | Admit: 2013-11-04 | Discharge: 2013-11-04 | Disposition: A | Discharge status: Home / Self Care | Payer: Medicaid Other | Source: Ambulatory Visit | Attending: Orthopedic Surgery | Admitting: Orthopedic Surgery
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
## 2013-11-10 ENCOUNTER — Emergency Department (HOSPITAL_COMMUNITY): Payer: Medicaid Other

## 2013-11-10 ENCOUNTER — Emergency Department (HOSPITAL_COMMUNITY)
Admission: EM | Admit: 2013-11-10 | Discharge: 2013-11-10 | Disposition: A | Discharge status: Home / Self Care | Payer: Medicaid Other | Attending: Emergency Medicine | Admitting: Emergency Medicine

## 2013-11-10 ENCOUNTER — Encounter (HOSPITAL_COMMUNITY): Payer: Self-pay | Admitting: Emergency Medicine

## 2013-11-10 DIAGNOSIS — Y92410 Unspecified street and highway as the place of occurrence of the external cause: Secondary | ICD-10-CM | POA: Insufficient documentation

## 2013-11-10 DIAGNOSIS — F17200 Nicotine dependence, unspecified, uncomplicated: Secondary | ICD-10-CM | POA: Insufficient documentation

## 2013-11-10 DIAGNOSIS — W050XXA Fall from non-moving wheelchair, initial encounter: Secondary | ICD-10-CM | POA: Insufficient documentation

## 2013-11-10 DIAGNOSIS — IMO0002 Reserved for concepts with insufficient information to code with codable children: Secondary | ICD-10-CM | POA: Insufficient documentation

## 2013-11-10 DIAGNOSIS — Z7982 Long term (current) use of aspirin: Secondary | ICD-10-CM | POA: Insufficient documentation

## 2013-11-10 DIAGNOSIS — S80819A Abrasion, unspecified lower leg, initial encounter: Secondary | ICD-10-CM

## 2013-11-10 DIAGNOSIS — Y9355 Activity, bike riding: Secondary | ICD-10-CM | POA: Insufficient documentation

## 2013-11-10 DIAGNOSIS — L089 Local infection of the skin and subcutaneous tissue, unspecified: Secondary | ICD-10-CM

## 2013-11-10 DIAGNOSIS — Z79899 Other long term (current) drug therapy: Secondary | ICD-10-CM | POA: Insufficient documentation

## 2013-11-10 MED ORDER — CEPHALEXIN 500 MG PO CAPS
500.0000 mg | ORAL_CAPSULE | Freq: Once | ORAL | Status: AC
  Administered 2013-11-10: 500 mg via ORAL
  Filled 2013-11-10: qty 1

## 2013-11-10 MED ORDER — BACITRACIN ZINC 500 UNIT/GM EX OINT
TOPICAL_OINTMENT | CUTANEOUS | Status: AC
  Administered 2013-11-10: 4 via TOPICAL
  Filled 2013-11-10: qty 3.6

## 2013-11-10 MED ORDER — BACITRACIN 500 UNIT/GM EX OINT
4.0000 "application " | TOPICAL_OINTMENT | Freq: Two times a day (BID) | CUTANEOUS | Status: DC
  Administered 2013-11-10: 4 via TOPICAL
  Filled 2013-11-10 (×4): qty 3.6

## 2013-11-10 MED ORDER — SULFAMETHOXAZOLE-TMP DS 800-160 MG PO TABS
1.0000 | ORAL_TABLET | Freq: Once | ORAL | Status: AC
  Administered 2013-11-10: 1 via ORAL
  Filled 2013-11-10: qty 1

## 2013-11-10 MED ORDER — OXYCODONE-ACETAMINOPHEN 5-325 MG PO TABS: 1.0000 | ORAL_TABLET | ORAL | Status: DC | PRN

## 2013-11-10 MED ORDER — SULFAMETHOXAZOLE-TRIMETHOPRIM 800-160 MG PO TABS: 1.0000 | ORAL_TABLET | Freq: Two times a day (BID) | ORAL | Status: DC

## 2013-11-10 MED ORDER — OXYCODONE-ACETAMINOPHEN 5-325 MG PO TABS
1.0000 | ORAL_TABLET | Freq: Once | ORAL | Status: AC
  Administered 2013-11-10: 1 via ORAL
  Filled 2013-11-10: qty 1

## 2013-11-10 MED ORDER — CEPHALEXIN 500 MG PO CAPS: 500.0000 mg | ORAL_CAPSULE | Freq: Four times a day (QID) | ORAL | Status: DC
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
## 2013-11-16 NOTE — ED Provider Notes (Signed)
Medical screening examination/treatment/procedure(s) were conducted as a shared visit with non-physician practitioner(s) and myself.  I personally evaluated the patient during the encounter.   EKG Interpretation None        Joya Gaskinsonald W Marvon Shillingburg, MD 11/16/13 0730

## 2014-06-08 ENCOUNTER — Emergency Department (HOSPITAL_COMMUNITY): Payer: Medicaid Other

## 2014-06-08 ENCOUNTER — Emergency Department (HOSPITAL_COMMUNITY)
Admission: EM | Admit: 2014-06-08 | Discharge: 2014-06-08 | Disposition: A | Discharge status: Home / Self Care | Payer: Medicaid Other | Attending: Emergency Medicine | Admitting: Emergency Medicine

## 2014-06-08 ENCOUNTER — Encounter (HOSPITAL_COMMUNITY): Payer: Self-pay | Admitting: Emergency Medicine

## 2014-06-08 DIAGNOSIS — W208XXA Other cause of strike by thrown, projected or falling object, initial encounter: Secondary | ICD-10-CM | POA: Insufficient documentation

## 2014-06-08 DIAGNOSIS — Y998 Other external cause status: Secondary | ICD-10-CM | POA: Diagnosis not present

## 2014-06-08 DIAGNOSIS — Y9389 Activity, other specified: Secondary | ICD-10-CM | POA: Diagnosis not present

## 2014-06-08 DIAGNOSIS — Z720 Tobacco use: Secondary | ICD-10-CM | POA: Diagnosis not present

## 2014-06-08 DIAGNOSIS — Z79899 Other long term (current) drug therapy: Secondary | ICD-10-CM | POA: Diagnosis not present

## 2014-06-08 DIAGNOSIS — S90512A Abrasion, left ankle, initial encounter: Secondary | ICD-10-CM | POA: Diagnosis not present

## 2014-06-08 DIAGNOSIS — Z792 Long term (current) use of antibiotics: Secondary | ICD-10-CM | POA: Diagnosis not present

## 2014-06-08 DIAGNOSIS — Y929 Unspecified place or not applicable: Secondary | ICD-10-CM | POA: Diagnosis not present

## 2014-06-08 DIAGNOSIS — S99912A Unspecified injury of left ankle, initial encounter: Secondary | ICD-10-CM | POA: Diagnosis present

## 2014-06-08 DIAGNOSIS — S99919A Unspecified injury of unspecified ankle, initial encounter: Secondary | ICD-10-CM

## 2014-06-08 DIAGNOSIS — S9032XA Contusion of left foot, initial encounter: Secondary | ICD-10-CM | POA: Diagnosis not present

## 2014-06-08 MED ORDER — OXYCODONE-ACETAMINOPHEN 5-325 MG PO TABS
1.0000 | ORAL_TABLET | Freq: Once | ORAL | Status: AC
  Administered 2014-06-08: 1 via ORAL
  Filled 2014-06-08: qty 1

## 2014-06-08 MED ORDER — HYDROCODONE-ACETAMINOPHEN 5-325 MG PO TABS: ORAL_TABLET | ORAL | Status: DC

## 2014-06-08 NOTE — ED Notes (Signed)
Pt alert & oriented x4, stable gait. Patient given discharge instructions, paperwork & prescription(s). Patient  instructed to stop at the registration desk to finish any additional paperwork. Patient verbalized understanding. Pt left department w/ no further questions. 
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
## 2014-06-08 NOTE — Discharge Instructions (Signed)
Abrasions An abrasion is a cut or scrape of the skin. Abrasions do not go through all layers of the skin. HOME CARE  If a bandage (dressing) was put on your wound, change it as told by your doctor. If the bandage sticks, soak it off with warm.  Wash the area with water and soap 2 times a day. Rinse off the soap. Pat the area dry with a clean towel.  Put on medicated cream (ointment) as told by your doctor.  Change your bandage right away if it gets wet or dirty.  Only take medicine as told by your doctor.  See your doctor within 24-48 hours to get your wound checked.  Check your wound for redness, puffiness (swelling), or yellowish-white fluid (pus). GET HELP RIGHT AWAY IF:   You have more pain in the wound.  You have redness, swelling, or tenderness around the wound.  You have pus coming from the wound.  You have a fever or lasting symptoms for more than 2-3 days.  You have a fever and your symptoms suddenly get worse.  You have a bad smell coming from the wound or bandage. MAKE SURE YOU:   Understand these instructions.  Will watch your condition.  Will get help right away if you are not doing well or get worse. Document Released: 12/12/2007 Document Revised: 03/19/2012 Document Reviewed: 05/29/2011 [facility] Patient Information 2015 [place], [place]LL[place]. This information is not intended to replace advice given to you by your health care provider. Make sure you discuss any questions you have with your health care provider.  Contusion A contusion is a deep bruise. Contusions happen when an injury causes bleeding under the skin. Signs of bruising include pain, puffiness (swelling), and discolored skin. The contusion may turn blue, purple, or yellow. HOME CARE   Put ice on the injured area.  Put ice in a plastic bag.  Place a towel between your skin and the bag.  Leave the ice on for 15-20 minutes, 03-04 times a day.  Only take medicine as told by your doctor.  Rest the  injured area.  If possible, raise (elevate) the injured area to lessen puffiness. GET HELP RIGHT AWAY IF:   You have more bruising or puffiness.  You have pain that is getting worse.  Your puffiness or pain is not helped by medicine. MAKE SURE YOU:   Understand these instructions.  Will watch your condition.  Will get help right away if you are not doing well or get worse. Document Released: 12/12/2007 Document Revised: 09/17/2011 Document Reviewed: 04/30/2011 [facility] Patient Information 2015 [place]Care, [place]LL[place]. This information is not intended to replace advice given to you by your health care provider. Make sure you discuss any questions you have with your health care provider.

## 2014-06-08 NOTE — ED Provider Notes (Signed)
CSN: [id number]     Arrival date & time 06/08/14  1951 History   First MD Initiated Contact with Patient 06/08/14 2013     Chief Complaint  Patient presents with  . Foot Injury  . Ankle Pain     (Consider location/radiation/quality/duration/timing/severity/associated sxs/prior Treatment) HPI   Trevor Hodges is a 33 y.o. male who presents to the Emergency Department complaining of pain to his left ankle and foot.  Patient states the pain began when he dropped a large television onto his foot and ankle. Injury occurred just prior to ED arrival. He reports pain with weightbearing or flexion of the ankle. He has applied ice with minimal relief. Is not taking any medications prior to coming to the emergency department. He also reports having an abrasion to the front of his ankle. He denies any numbness, knee pain, calf pain or swelling. Td is UTD  History reviewed. No pertinent past medical history. History reviewed. No pertinent past surgical history. History reviewed. No pertinent family history. History  Substance Use Topics  . Smoking status: Current Every Day Smoker -- 1.00 packs/day  . Smokeless tobacco: Not on file  . Alcohol Use: Yes     Comment: occassional     Review of Systems  Constitutional: Negative for fever and chills.  Genitourinary: Negative for dysuria and difficulty urinating.  Musculoskeletal: Positive for joint swelling and arthralgias.  Skin: Negative for color change and wound.  All other systems reviewed and are negative.     Allergies  Toradol and Ultram  Home Medications   Prior to Admission medications   Medication Sig Start Date End Date Taking? Authorizing Provider  Aspirin-Acetaminophen-Caffeine (GOODY HEADACHE PO) Take 1 packet by mouth daily as needed (for migraine/pain).    Historical Provider, MD  cephALEXin (KEFLEX) 500 MG capsule Take 1 capsule (500 mg total) by mouth 4 (four) times daily. For 10 days 11/10/13   Frankie Zito L. Elpidio Thielen, PA-C   diphenhydrAMINE (BENADRYL) 25 MG tablet Take 50 mg by mouth once as needed for itching or allergies.    Historical Provider, MD  gabapentin (NEURONTIN) 300 MG capsule Take 300 mg by mouth 3 (three) times daily.    Historical Provider, MD  HYDROcodone-acetaminophen (NORCO/VICODIN) 5-325 MG per tablet Take one-two tabs po q 4-6 hrs prn pain 06/08/14   Sunset Joshi L. Johnnell Liou, PA-C  oxyCODONE-acetaminophen (PERCOCET/ROXICET) 5-325 MG per tablet Take 1 tablet by mouth every 4 (four) hours as needed for severe pain. 11/10/13   Dhruva Orndoff L. Saga Balthazar, PA-C  sulfamethoxazole-trimethoprim (SEPTRA DS) 800-160 MG per tablet Take 1 tablet by mouth 2 (two) times daily. For 10 days 11/10/13   Anastasiya Gowin L. Tinlee Navarrette, PA-C  tiZANidine (ZANAFLEX) 4 MG tablet Take 4 mg by mouth daily as needed for muscle spasms.    Historical Provider, MD   BP 126/79 mmHg  Pulse 77  Temp(Src) 98.9 F (37.2 C) (Oral)  Resp 20  Ht 6' (1.829 m)  Wt 180 lb (81.647 kg)  BMI 24.41 kg/m2  SpO2 99% Physical Exam  Constitutional: He is oriented to person, place, and time. He appears well-developed and well-nourished. No distress.  HENT:  Head: Normocephalic and atraumatic.  Cardiovascular: Normal rate, regular rhythm, normal heart sounds and intact distal pulses.   Pulmonary/Chest: Effort normal and breath sounds normal. No respiratory distress.  Musculoskeletal: He exhibits edema and tenderness.  Diffuse left ankle ttp, DP pulse is brisk,distal sensation intact.  No erythema or bony deformity.  No proximal tenderness.  Compartments of the left  LE are soft  Neurological: He is alert and oriented to person, place, and time. He exhibits normal muscle tone. Coordination normal.  Skin: Skin is warm and dry.  superficial abrasion to anterior left ankle.  No edema or bleeding.   Nursing note and vitals reviewed.   ED Course  Procedures (including critical care time) Labs Review Labs Reviewed - No data to display  Imaging Review Dg Ankle Complete  Left  06/08/2014   CLINICAL DATA:  Crush injury left ankle tonight. Left ankle pain. Initial encounter.  EXAM: LEFT ANKLE COMPLETE - 3+ VIEW  COMPARISON:  Plain films left ankle 12/21/2012.  FINDINGS: Imaged bones, joints and soft tissues appear normal.  IMPRESSION: Negative exam.   Electronically Signed   By: Drusilla Kannerhomas  Dalessio M.D.   On: 06/08/2014 20:31   Dg Foot Complete Left  06/08/2014   CLINICAL DATA:  Crush injury left foot tonight. Left foot pain. Initial encounter.  EXAM: LEFT FOOT - COMPLETE 3+ VIEW  COMPARISON:  None.  FINDINGS: Imaged bones, joints and soft tissues appear normal.  IMPRESSION: Normal exam.   Electronically Signed   By: Drusilla Kannerhomas  Dalessio M.D.   On: 06/08/2014 20:30     EKG Interpretation None      MDM   Final diagnoses:  Injury of ankle  Abrasion, ankle without infection, left, initial encounter  Contusion, foot, left, initial encounter    Abrasion bandaged.  Ace wrap applied, crutches given.  Pt agrees to elevate, ice and ortho f/u.      [place] Merry L. Trisha Mangleriplett, PA-C 06/09/14 0050  Geoffery Lyonsouglas Delo, MD 06/09/14 2249

## 2015-02-28 IMAGING — CR DG TIBIA/FIBULA 2V*R*
2 series · 2 of 2 positions shown · non-contrast
Comparison: None.

CLINICAL DATA: Right lower leg pain, right ankle pain

EXAM:
RIGHT TIBIA AND FIBULA - 2 VIEW

[view not recorded (1 of 2)]
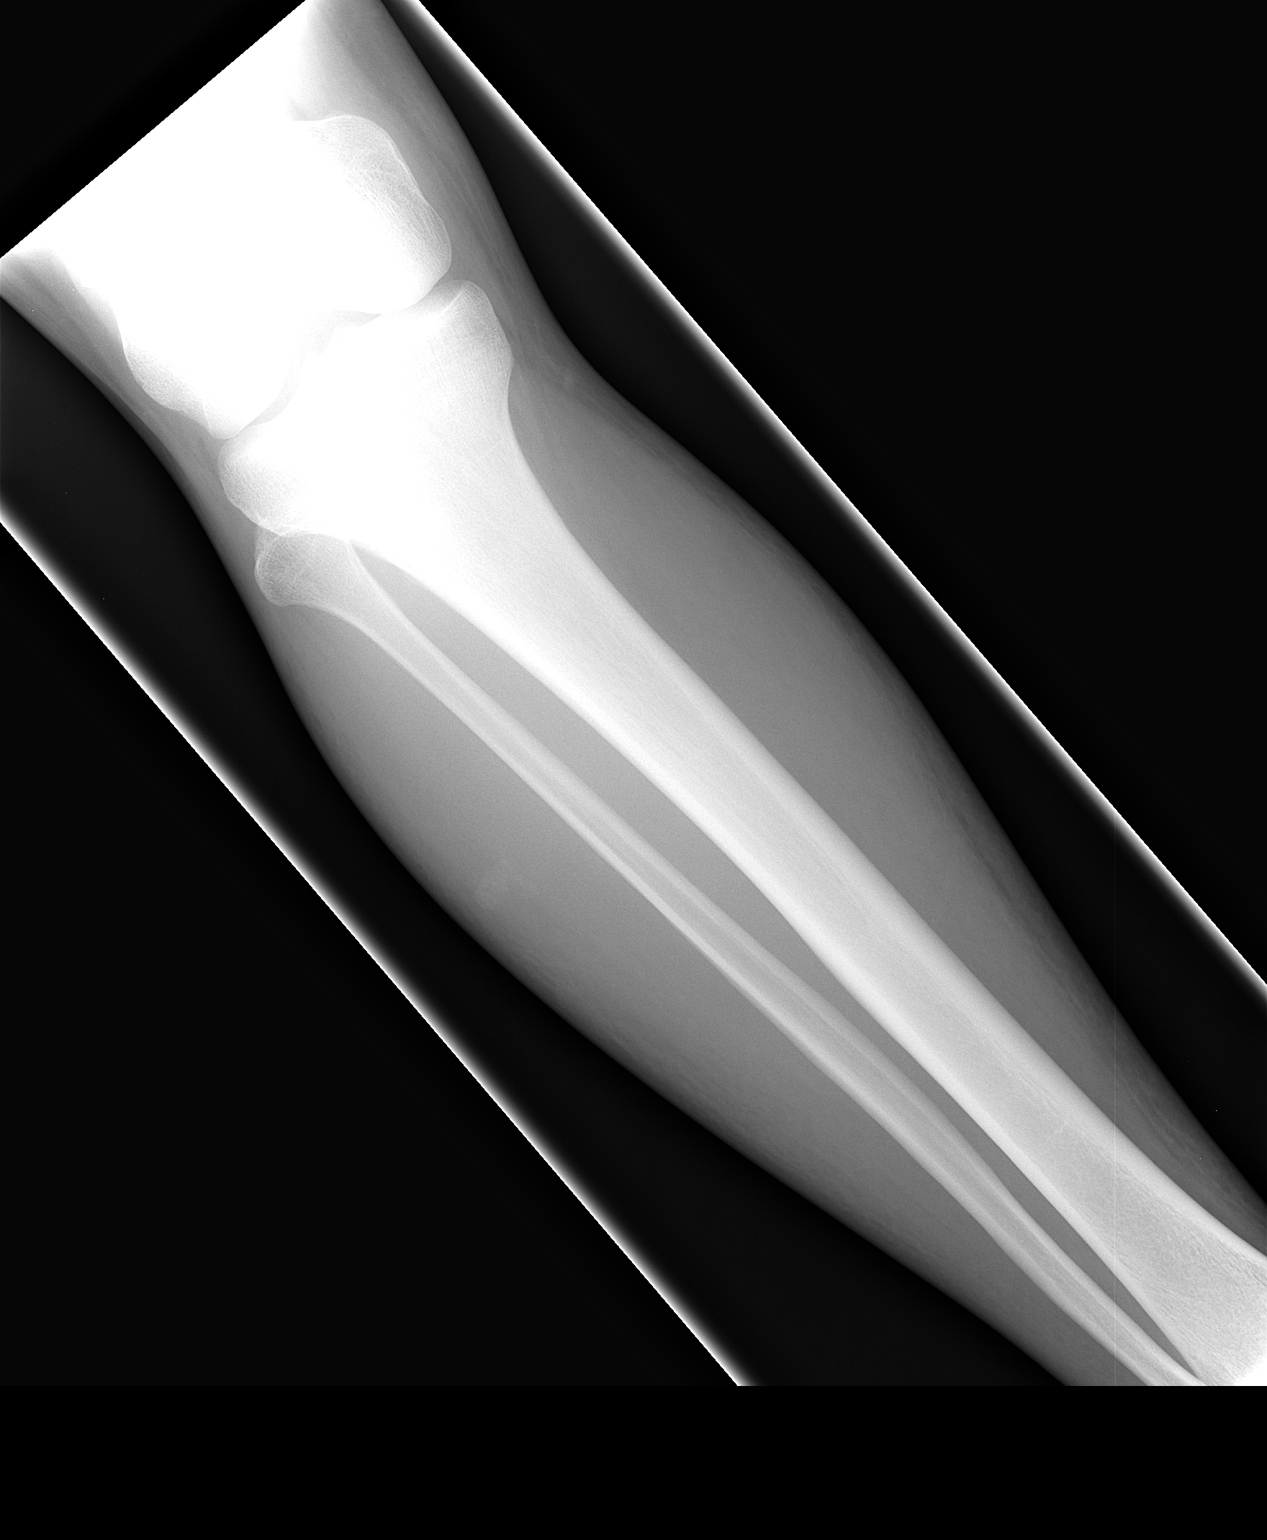

[view not recorded (2 of 2)]
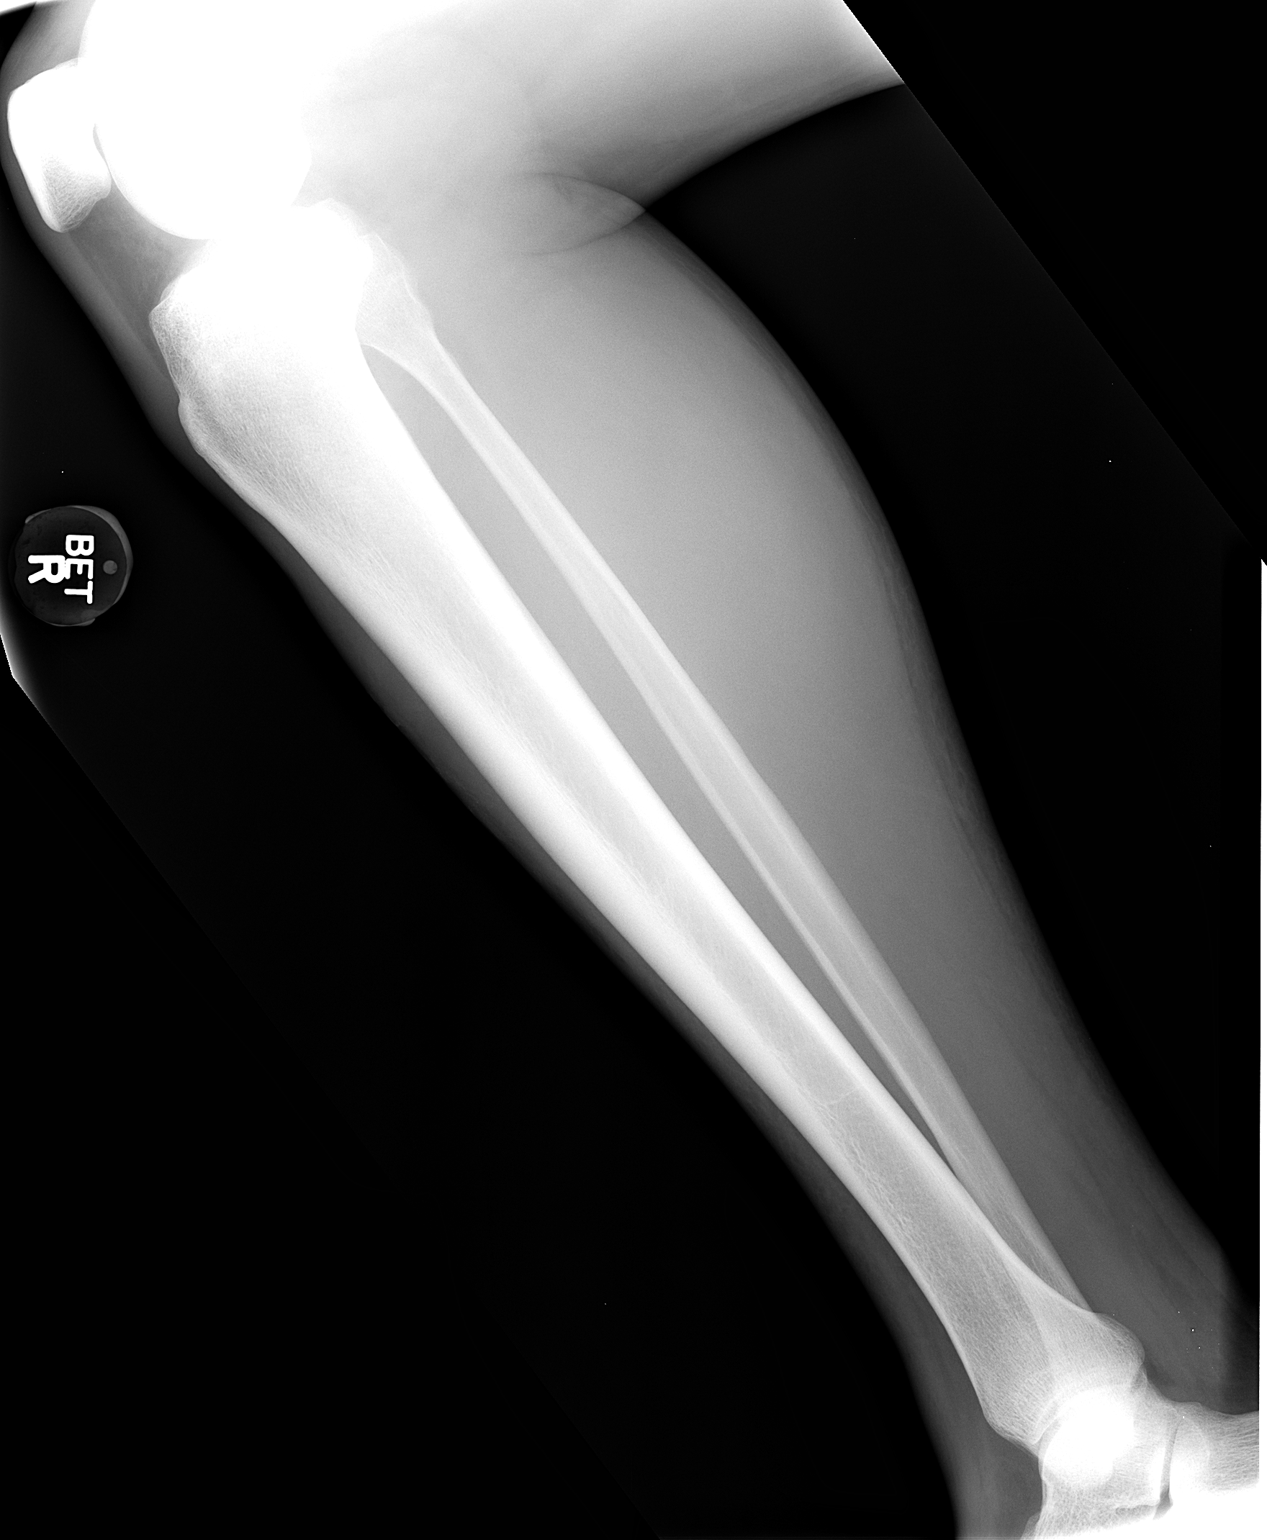

[2 of 2 positions shown; findings below may reference images not displayed]

FINDINGS: There is no evidence of fracture or other focal bone lesions. Soft
tissues are unremarkable.
IMPRESSION: Negative.

## 2015-03-27 ENCOUNTER — Encounter (HOSPITAL_COMMUNITY): Payer: Self-pay | Admitting: Emergency Medicine

## 2015-03-27 ENCOUNTER — Emergency Department (HOSPITAL_COMMUNITY)
Admission: EM | Admit: 2015-03-27 | Discharge: 2015-03-27 | Disposition: A | Discharge status: Home / Self Care | Payer: Medicaid Other | Attending: Emergency Medicine | Admitting: Emergency Medicine

## 2015-03-27 DIAGNOSIS — Z720 Tobacco use: Secondary | ICD-10-CM | POA: Diagnosis not present

## 2015-03-27 DIAGNOSIS — Z79899 Other long term (current) drug therapy: Secondary | ICD-10-CM | POA: Diagnosis not present

## 2015-03-27 DIAGNOSIS — L02415 Cutaneous abscess of right lower limb: Secondary | ICD-10-CM | POA: Insufficient documentation

## 2015-03-27 DIAGNOSIS — L0291 Cutaneous abscess, unspecified: Secondary | ICD-10-CM

## 2015-03-27 MED ORDER — DOXYCYCLINE HYCLATE 100 MG PO CAPS: 100.0000 mg | ORAL_CAPSULE | Freq: Two times a day (BID) | ORAL | Status: DC

## 2015-03-27 MED ORDER — IBUPROFEN 600 MG PO TABS: 600.0000 mg | ORAL_TABLET | Freq: Three times a day (TID) | ORAL | Status: DC

## 2015-03-27 MED ORDER — HYDROCODONE-ACETAMINOPHEN 5-325 MG PO TABS: 1.0000 | ORAL_TABLET | ORAL | Status: DC | PRN

## 2015-03-27 MED ORDER — DOXYCYCLINE HYCLATE 100 MG PO TABS
100.0000 mg | ORAL_TABLET | Freq: Once | ORAL | Status: AC
  Administered 2015-03-27: 100 mg via ORAL
  Filled 2015-03-27: qty 1

## 2015-03-27 MED ORDER — IBUPROFEN 800 MG PO TABS
800.0000 mg | ORAL_TABLET | Freq: Once | ORAL | Status: AC
  Administered 2015-03-27: 800 mg via ORAL
  Filled 2015-03-27: qty 1

## 2015-03-27 MED ORDER — HYDROCODONE-ACETAMINOPHEN 5-325 MG PO TABS
1.0000 | ORAL_TABLET | Freq: Once | ORAL | Status: AC
  Administered 2015-03-27: 1 via ORAL
  Filled 2015-03-27: qty 1

## 2015-03-27 NOTE — ED Provider Notes (Signed)
CSN: [id number]     Arrival date & time 03/27/15  1158 History  This chart was scribed for non-physician practitioner, Ivery Quale, PA-C working with Donnetta Hutching, MD by Doreatha Martin, ED scribe. This patient was seen in room [facility]24[facility] and the patient's care was started at 2:24 PM    Chief Complaint  Patient presents with  . Abscess   Patient is a 34 y.o. male presenting with abscess. The history is provided by the patient. No language interpreter was used.  Abscess Location:  Leg Leg abscess location:  R lower leg Abscess quality: draining, painful and redness   Red streaking: no   Duration:  4 days Pain details:    Quality:  Sharp   Severity:  Moderate Chronicity:  New Associated symptoms: no fever    HPI Comments: Trevor Hodges is a 34 y.o. male who presents to the Emergency Department complaining of a moderate, gradually worsening area of pain, redness and swelling to the right lower lateral leg onset 4 days ago. He states that he attempted to drain the area with clear drainage. Pt notes that the area started very small. He states pain is worsened with palpation, movement. He states he has been keeping the area covered. NKDA. No other medical conditions, medications. No known injury or bites. He denies fever, red streaking.   History reviewed. No pertinent past medical history. History reviewed. No pertinent past surgical history. History reviewed. No pertinent family history. Social History  Substance Use Topics  . Smoking status: Current Every Day Smoker -- 1.00 packs/day for 20 years    Types: Cigarettes  . Smokeless tobacco: Never Used  . Alcohol Use: No    Review of Systems  Constitutional: Negative for fever.  Skin: Positive for color change.       +area of pain, swelling and redness to the lateral right lower leg  All other systems reviewed and are negative.  Allergies  Toradol and Ultram  Home Medications   Prior to Admission medications   Medication Sig  Start Date End Date Taking? Authorizing Provider  Aspirin-Acetaminophen-Caffeine (GOODY HEADACHE PO) Take 1 packet by mouth daily as needed (for migraine/pain).    Historical Provider, MD  cephALEXin (KEFLEX) 500 MG capsule Take 1 capsule (500 mg total) by mouth 4 (four) times daily. For 10 days 11/10/13   Tammy Triplett, PA-C  diphenhydrAMINE (BENADRYL) 25 MG tablet Take 50 mg by mouth once as needed for itching or allergies.    Historical Provider, MD  gabapentin (NEURONTIN) 300 MG capsule Take 300 mg by mouth 3 (three) times daily.    Historical Provider, MD  HYDROcodone-acetaminophen (NORCO/VICODIN) 5-325 MG per tablet Take one-two tabs po q 4-6 hrs prn pain 06/08/14   Tammy Triplett, PA-C  oxyCODONE-acetaminophen (PERCOCET/ROXICET) 5-325 MG per tablet Take 1 tablet by mouth every 4 (four) hours as needed for severe pain. 11/10/13   Tammy Triplett, PA-C  sulfamethoxazole-trimethoprim (SEPTRA DS) 800-160 MG per tablet Take 1 tablet by mouth 2 (two) times daily. For 10 days 11/10/13   Tammy Triplett, PA-C  tiZANidine (ZANAFLEX) 4 MG tablet Take 4 mg by mouth daily as needed for muscle spasms.    Historical Provider, MD   BP 125/82 mmHg  Pulse 94  Temp(Src) 98.8 F (37.1 C) (Oral)  Resp 16  Ht 6' (1.829 m)  Wt 175 lb (79.379 kg)  BMI 23.73 kg/m2  SpO2 100% Physical Exam  Constitutional: He is oriented to person, place, and time. He appears well-developed and  well-nourished.  HENT:  Head: Normocephalic and atraumatic.  Eyes: Conjunctivae and EOM are normal. Pupils are equal, round, and reactive to light.  Neck: Normal range of motion. Neck supple.  Cardiovascular: Normal rate, regular rhythm and normal heart sounds.   Pulmonary/Chest: Effort normal and breath sounds normal. No respiratory distress.  Bilateral rhonchi.   Abdominal: He exhibits no distension.  Musculoskeletal: Normal range of motion.  Neurological: He is alert and oriented to person, place, and time.  Skin: Skin is warm and  dry.  5cm area of increased redness on the lateral tib-fib area on the right. No red streaks appreciated. There are scabbed lesions on the anterior tibial area. The reddened area is warm, but not hot.   Psychiatric: He has a normal mood and affect. His behavior is normal.  Nursing note and vitals reviewed.   ED Course  Procedures (including critical care time) DIAGNOSTIC STUDIES: Oxygen Saturation is 100% on RA, normal by my interpretation.    COORDINATION OF CARE: 2:30 PM Discussed treatment plan with pt at bedside and pt agreed to plan. Plan to treat with doxycycline, ultram and tub soaks.   Labs Review Labs Reviewed - No data to display  Imaging Review No results found. I have personally reviewed and evaluated these images and lab results as part of my medical decision-making.   EKG Interpretation None      MDM  Exam favors small abscess of the right lower leg. Pt not a candidate for I and D at this time. No temp elevation  Or tachycardia at this time. Rx for doxycycline, norco, and ibuprofen given to the patient.   Final diagnoses:  Abscess    *I have reviewed nursing notes, vital signs, and all appropriate lab and imaging results for this patient.** **I personally performed the services described in this documentation, which was scribed in my presence. The recorded information has been reviewed and is accurate.Ivery Quale, PA-C 03/30/15 1142  Donnetta Hutching, MD 04/02/15 [phone]

## 2015-03-27 NOTE — ED Notes (Signed)
Patient c/o abscess to right lower leg x4 days. Unsure of any fevers but reports night sweats. Per patient drained " little clear" drainage after "squeezing" it the first day appeared. Denies any drainage since. Per patient has greatly increased in size. Patient has taken ibuprofen and goody powders with no relief.

## 2015-03-27 NOTE — Discharge Instructions (Signed)
Please soak in warm Epsom salt water daily. Please use the antrum biotic 2 times daily with food until all taken. Abscess An abscess (boil or furuncle) is an infected area on or under the skin. This area is filled with yellowish-white fluid (pus) and other material (debris). HOME CARE   Only take medicines as told by your doctor.  If you were given antibiotic medicine, take it as directed. Finish the medicine even if you start to feel better.  If gauze is used, follow your doctor's directions for changing the gauze.  To avoid spreading the infection:  Keep your abscess covered with a bandage.  Wash your hands well.  Do not share personal care items, towels, or whirlpools with others.  Avoid skin contact with others.  Keep your skin and clothes clean around the abscess.  Keep all doctor visits as told. GET HELP RIGHT AWAY IF:   You have more pain, puffiness (swelling), or redness in the wound site.  You have more fluid or blood coming from the wound site.  You have muscle aches, chills, or you feel sick.  You have a fever. MAKE SURE YOU:   Understand these instructions.  Will watch your condition.  Will get help right away if you are not doing well or get worse. Document Released: 12/12/2007 Document Revised: 12/25/2011 Document Reviewed: 09/07/2011 [facility] Patient Information 2015 [place], [place]. This information is not intended to replace advice given to you by your health care provider. Make sure you discuss any questions you have with your health care provider.

## 2015-09-10 ENCOUNTER — Emergency Department (HOSPITAL_COMMUNITY)
Admission: EM | Admit: 2015-09-10 | Discharge: 2015-09-10 | Disposition: A | Discharge status: Home / Self Care | Payer: Medicaid Other

## 2015-09-10 NOTE — ED Notes (Signed)
Per registration, pt left.  

## 2016-04-24 ENCOUNTER — Encounter (HOSPITAL_COMMUNITY): Payer: Self-pay | Admitting: Emergency Medicine

## 2016-04-24 ENCOUNTER — Emergency Department (HOSPITAL_COMMUNITY)
Admission: EM | Admit: 2016-04-24 | Discharge: 2016-04-24 | Disposition: A | Discharge status: Home / Self Care | Payer: Medicaid Other | Attending: Emergency Medicine | Admitting: Emergency Medicine

## 2016-04-24 DIAGNOSIS — Z791 Long term (current) use of non-steroidal anti-inflammatories (NSAID): Secondary | ICD-10-CM | POA: Insufficient documentation

## 2016-04-24 DIAGNOSIS — L03114 Cellulitis of left upper limb: Secondary | ICD-10-CM | POA: Insufficient documentation

## 2016-04-24 DIAGNOSIS — F17210 Nicotine dependence, cigarettes, uncomplicated: Secondary | ICD-10-CM | POA: Insufficient documentation

## 2016-04-24 DIAGNOSIS — Z7982 Long term (current) use of aspirin: Secondary | ICD-10-CM | POA: Diagnosis not present

## 2016-04-24 DIAGNOSIS — Z79899 Other long term (current) drug therapy: Secondary | ICD-10-CM | POA: Diagnosis not present

## 2016-04-24 DIAGNOSIS — L02414 Cutaneous abscess of left upper limb: Secondary | ICD-10-CM

## 2016-04-24 MED ORDER — SULFAMETHOXAZOLE-TRIMETHOPRIM 800-160 MG PO TABS
1.0000 | ORAL_TABLET | Freq: Once | ORAL | Status: AC
  Administered 2016-04-24: 1 via ORAL
  Filled 2016-04-24: qty 1

## 2016-04-24 MED ORDER — SULFAMETHOXAZOLE-TRIMETHOPRIM 800-160 MG PO TABS: 1.0000 | ORAL_TABLET | Freq: Two times a day (BID) | ORAL | 0 refills | Status: AC

## 2016-04-24 MED ORDER — ACETAMINOPHEN 325 MG PO TABS
650.0000 mg | ORAL_TABLET | Freq: Once | ORAL | Status: AC
  Administered 2016-04-24: 650 mg via ORAL
  Filled 2016-04-24: qty 2

## 2016-04-24 MED ORDER — LIDOCAINE-EPINEPHRINE (PF) 2 %-1:200000 IJ SOLN
20.0000 mL | Freq: Once | INTRAMUSCULAR | Status: AC
  Administered 2016-04-24: 20 mL
  Filled 2016-04-24: qty 20

## 2016-04-24 NOTE — ED Provider Notes (Signed)
[facility]-EMERGENCY DEPT Provider Note   CSN: [id number] Arrival date & time: 04/24/16  [id number]  By signing my name below, I, Trevor Hodges, attest that this documentation has been prepared under the direction and in the presence of Azalia BilisKevin Malayshia All, MD. Electronically Signed: Placido SouLogan Hodges, ED Scribe. 04/24/16. 9:53 AM.  History   Chief Complaint Chief Complaint  Patient presents with  . Abscess    HPI HPI Comments: Trevor Hodges is a 35 y.o. male who presents to the Emergency Department complaining of multiple points of redness and swelling to his left forearm x 3 days. He states they began as a "pimple" and gradually worsened to include swelling, redness and radiating pain in his LUE. He has applied heat to the region and his spouse has attempted to open the wounds with "a sterile needle". Pt reports a h/o abscesses the the affected region. No other associated symptoms at this time.   The history is provided by the patient and the spouse. No language interpreter was used.    History reviewed. No pertinent past medical history.  Patient Active Problem List   Diagnosis Date Noted  . 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 Dog bite of hand 07/31/2012    History reviewed. No pertinent surgical history.   Home Medications    Prior to Admission medications   Medication Sig Start Date End Date Taking? Authorizing Provider  Aspirin-Acetaminophen-Caffeine (GOODY HEADACHE PO) Take 1 packet by mouth daily as needed (for migraine/pain).    Historical Provider, MD  cephALEXin (KEFLEX) 500 MG capsule Take 1 capsule (500 mg total) by mouth 4 (four) times daily. For 10 days 11/10/13   Tammy Triplett, PA-C  diphenhydrAMINE (BENADRYL) 25 MG tablet Take 50 mg by mouth once as needed for itching or allergies.    Historical Provider, MD  doxycycline (VIBRAMYCIN) 100 MG capsule Take 1 capsule (100 mg total) by mouth 2 (two) times daily. 03/27/15   Ivery QualeHobson Bryant, PA-C  gabapentin (NEURONTIN) 300  MG capsule Take 300 mg by mouth 3 (three) times daily.    Historical Provider, MD  HYDROcodone-acetaminophen (NORCO/VICODIN) 5-325 MG per tablet Take 1 tablet by mouth every 4 (four) hours as needed. 03/27/15   Ivery QualeHobson Bryant, PA-C  ibuprofen (ADVIL,MOTRIN) 600 MG tablet Take 1 tablet (600 mg total) by mouth 3 (three) times daily. 03/27/15   Ivery QualeHobson Bryant, PA-C  oxyCODONE-acetaminophen (PERCOCET/ROXICET) 5-325 MG per tablet Take 1 tablet by mouth every 4 (four) hours as needed for severe pain. 11/10/13   Tammy Triplett, PA-C  sulfamethoxazole-trimethoprim (SEPTRA DS) 800-160 MG per tablet Take 1 tablet by mouth 2 (two) times daily. For 10 days 11/10/13   Tammy Triplett, PA-C  tiZANidine (ZANAFLEX) 4 MG tablet Take 4 mg by mouth daily as needed for muscle spasms.    Historical Provider, MD    Family History No family history on file.  Social History Social History  Substance Use Topics  . Smoking status: Current Every Day Smoker    Packs/day: 1.00    Years: 20.00    Types: Cigarettes  . Smokeless tobacco: Never Used  . Alcohol use No     Allergies   Toradol [ketorolac tromethamine] and Ultram [tramadol]   Review of Systems Review of Systems  Musculoskeletal: Positive for arthralgias, joint swelling and myalgias.  Skin: Positive for color change and wound.   Physical Exam Updated Vital Signs BP 142/85   Pulse 93   Temp 98.3 F (36.8 C)   Resp 18   Ht 6' (  1.829 m)   Wt 183 lb (83 kg)   SpO2 97%   BMI 24.82 kg/m   Physical Exam  Constitutional: He is oriented to person, place, and time. He appears well-developed and well-nourished.  HENT:  Head: Normocephalic.  Eyes: EOM are normal.  Neck: Normal range of motion.  Pulmonary/Chest: Effort normal.  Abdominal: He exhibits no distension.  Musculoskeletal: Normal range of motion.  Two moderate sized abscesses of the dorsal aspect of the left forearm with drainage and small surrounding erythema. FROM of the left wrist and elbow.    Neurological: He is alert and oriented to person, place, and time.  Skin: There is erythema.  Psychiatric: He has a normal mood and affect.  Nursing note and vitals reviewed.  ED Treatments / Results  Labs (all labs ordered are listed, but only abnormal results are displayed) Labs Reviewed - No data to display  EKG  EKG Interpretation None       Radiology No results found.    +++++++++++++++++++++++++++++++++++++++++++++++++++++++++   Procedures .Marland KitchenIncision and Drainage Performed by: Azalia Bilis Authorized by: Azalia Bilis   Consent:    Consent obtained:  Verbal   Consent given by:  Patient   Risks discussed:  Pain Location:    Type:  Abscess   Location:  Upper extremity   Upper extremity location:  Arm   Arm location:  L lower arm Pre-procedure details:    Skin preparation:  Betadine Anesthesia (see MAR for exact dosages):    Anesthesia method:  Local infiltration   Local anesthetic:  Lidocaine 2% WITH epi Procedure type:    Complexity:  Complex Procedure details:    Incision types:  Single straight   Scalpel blade:  11   Drainage:  Purulent   Drainage amount:  Moderate   Wound treatment:  Wound left open   Packing materials:  None Post-procedure details:    Patient tolerance of procedure:  Tolerated well, no immediate complications       Incision and Drainage #2 Performed by: Azalia Bilis Authorized by: Azalia Bilis   Risks discussed:  Pain Location:    Type:  Abscess   Location:  Upper extremity   Upper extremity location:  Arm   Arm location:  L lower arm Pre-procedure details:    Skin preparation:  Betadine Anesthesia (see MAR for exact dosages):    Anesthesia method:  Local infiltration   Local anesthetic:  Lidocaine 2% WITH epi Procedure type:    Complexity:  Complex Procedure details:    Incision types:  Single straight   Scalpel blade:  11   Drainage:  Purulent   Drainage amount:  Moderate   Wound treatment:  Wound left  open   Packing materials:  None Post-procedure details:    Patient tolerance of procedure:  Tolerated well, no immediate complications   +++++++++++++++++++++++++++++++++++++++++++++++++    DIAGNOSTIC STUDIES: Oxygen Saturation is 97% on RA, normal by my interpretation.    COORDINATION OF CARE: 9:46 AM Discussed next steps with pt. Pt verbalized understanding and is agreeable with the plan.    Medications Ordered in ED Medications  acetaminophen (TYLENOL) tablet 650 mg (not administered)  sulfamethoxazole-trimethoprim (BACTRIM DS,SEPTRA DS) 800-160 MG per tablet 1 tablet (not administered)  lidocaine-EPINEPHrine (XYLOCAINE W/EPI) 2 %-1:200000 (PF) injection 20 mL (not administered)     Initial Impression / Assessment and Plan / ED Course  I have reviewed the triage vital signs and the nursing notes.  Pertinent labs & imaging results that were available during  my care of the patient were reviewed by me and considered in my medical decision making (see chart for details).  Clinical Course    Incision and drainage of both abscesses with purulent material removed.  Patient be placed on antibiotics.  Patient with cellulitis.  He understands to return to the ER for new or worsening symptoms.   I personally performed the services described in this documentation, which was scribed in my presence. The recorded information has been reviewed and is accurate.      Final Clinical Impressions(s) / ED Diagnoses   Final diagnoses:  Abscess of left forearm  Cellulitis of left upper extremity    New Prescriptions New Prescriptions   SULFAMETHOXAZOLE-TRIMETHOPRIM (BACTRIM DS,SEPTRA DS) 800-160 MG TABLET    Take 1 tablet by mouth 2 (two) times daily.     Azalia Bilis, MD 04/24/16 1054

## 2016-04-24 NOTE — ED Triage Notes (Signed)
Abscess x 2 to left forearm x 3 days.

## 2018-09-05 ENCOUNTER — Emergency Department (HOSPITAL_COMMUNITY): Payer: Medicaid Other

## 2018-09-05 ENCOUNTER — Ambulatory Visit (HOSPITAL_COMMUNITY)
Admission: EM | Admit: 2018-09-05 | Discharge: 2018-09-05 | Disposition: A | Discharge status: Home / Self Care | Payer: Medicaid Other

## 2018-09-05 ENCOUNTER — Other Ambulatory Visit: Payer: Self-pay

## 2018-09-05 ENCOUNTER — Emergency Department (HOSPITAL_COMMUNITY)
Admission: EM | Admit: 2018-09-05 | Discharge: 2018-09-05 | Discharge status: Against Medical Advice | Payer: Medicaid Other | Attending: Emergency Medicine | Admitting: Emergency Medicine

## 2018-09-05 DIAGNOSIS — F17210 Nicotine dependence, cigarettes, uncomplicated: Secondary | ICD-10-CM | POA: Insufficient documentation

## 2018-09-05 DIAGNOSIS — R079 Chest pain, unspecified: Secondary | ICD-10-CM | POA: Diagnosis present

## 2018-09-05 DIAGNOSIS — R0789 Other chest pain: Secondary | ICD-10-CM

## 2018-09-05 LAB — I-STAT TROPONIN, ED
TROPONIN I, POC: 0 ng/mL (ref 0.00–0.08)
Troponin i, poc: 0 ng/mL (ref 0.00–0.08)

## 2018-09-05 LAB — CBC
HCT: 48.2 % (ref 39.0–52.0)
Hemoglobin: 15.9 g/dL (ref 13.0–17.0)
MCH: 29.7 pg (ref 26.0–34.0)
MCHC: 33 g/dL (ref 30.0–36.0)
MCV: 89.9 fL (ref 80.0–100.0)
NRBC: 0 % (ref 0.0–0.2)
Platelets: 228 10*3/uL (ref 150–400)
RBC: 5.36 MIL/uL (ref 4.22–5.81)
RDW: 12 % (ref 11.5–15.5)
WBC: 11 10*3/uL — ABNORMAL HIGH (ref 4.0–10.5)

## 2018-09-05 LAB — BASIC METABOLIC PANEL
Anion gap: 12 (ref 5–15)
BUN: 16 mg/dL (ref 6–20)
CO2: 27 mmol/L (ref 22–32)
Calcium: 9.8 mg/dL (ref 8.9–10.3)
Chloride: 100 mmol/L (ref 98–111)
Creatinine, Ser: 1.15 mg/dL (ref 0.61–1.24)
GFR calc Af Amer: >60 mL/min (ref >60)
GFR calc non Af Amer: >60 mL/min (ref >60)
Glucose, Bld: 118 mg/dL — ABNORMAL HIGH (ref 70–99)
Potassium: 4.5 mmol/L (ref 3.5–5.1)
SODIUM: 139 mmol/L (ref 135–145)

## 2018-09-05 MED ORDER — SODIUM CHLORIDE 0.9% FLUSH: 3.0000 mL | Freq: Once | INTRAVENOUS | Status: DC

## 2018-09-05 NOTE — ED Notes (Signed)
Pt seen leaving lobby. Saying '6 hours unbelievable'. Noted pt has only been here for 4 hours.

## 2018-09-05 NOTE — ED Triage Notes (Signed)
Pt sent from [facility] for cp rule out. Pt has has left sided CP for 3 days-family hx of MI (mom & dad [place] age) Pt states his left chest feels like its in a "cramp".

## 2018-09-05 NOTE — ED Provider Notes (Signed)
[facility] EMERGENCY DEPARTMENT Provider Note   CSN: [id number] Arrival date & time: 09/05/18  1418    History   Chief Complaint Chief Complaint  Patient presents with  . Chest Pain    HPI Trevor Hodges is a 38 y.o. male who presents with a 3-day history of intermittent left-sided chest pain.  He describes it as a bruise.  Nothing seems to make it better or worse, except for belching.  It has radiated to his left arm and left-sided jaw.  He has no pain at this time.  He denies any shortness of breath, nausea, vomiting, new leg pain or swelling.  He denies any recent long trips, surgeries, known cancer, history of blood clots.  He reports he had a random pain in his right calf that was fleeting last night and gone in a few seconds.  This pain has not persisted.  He reports drinking Coke and making him burp and his symptoms improved.  He does have family history of cardiac issues, including MI.  He is a smoker.     HPI  No past medical history on file.  Patient Active Problem List   Diagnosis Date Noted  . 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 Dog bite of hand 07/31/2012    No past surgical history on file.      Home Medications    Prior to Admission medications   Medication Sig Start Date End Date Taking? Authorizing Provider  ibuprofen (ADVIL,MOTRIN) 200 MG tablet Take 400 mg by mouth every 6 (six) hours as needed.    [provider]    Family History No family history on file.  Social History Social History   Tobacco Use  . Smoking status: Current Every Day Smoker    Packs/day: 1.00    Years: 20.00    Pack years: 20.00    Types: Cigarettes  . Smokeless tobacco: Never Used  Substance Use Topics  . Alcohol use: No  . Drug use: No     Allergies   Toradol [ketorolac tromethamine] and Ultram [tramadol]   Review of Systems Review of Systems  Constitutional: Negative for chills and fever.  HENT: Negative for facial  swelling and sore throat.   Respiratory: Negative for shortness of breath.   Cardiovascular: Positive for chest pain.  Gastrointestinal: Negative for abdominal pain, nausea and vomiting.  Genitourinary: Negative for dysuria.  Musculoskeletal: Negative for back pain.  Skin: Negative for rash and wound.  Neurological: Negative for headaches.  Psychiatric/Behavioral: The patient is not nervous/anxious.      Physical Exam Updated Vital Signs BP 132/88 (BP Location: Right Arm)   Pulse 77   Temp 98.1 F (36.7 C) (Oral)   Resp 16   Ht 5\' 11"  (1.803 m)   Wt 79.4 kg   SpO2 93%   BMI 24.41 kg/m   Physical Exam Vitals signs and nursing note reviewed.  Constitutional:      General: He is not in acute distress.    Appearance: He is well-developed. He is not diaphoretic.  HENT:     Head: Normocephalic and atraumatic.     Mouth/Throat:     Pharynx: No oropharyngeal exudate.  Eyes:     General: No scleral icterus.       Right eye: No discharge.        Left eye: No discharge.     Conjunctiva/sclera: Conjunctivae normal.     Pupils: Pupils are equal, round, and reactive to light.  Neck:     Musculoskeletal: Normal range of motion and neck supple.     Thyroid: No thyromegaly.  Cardiovascular:     Rate and Rhythm: Normal rate and regular rhythm.     Heart sounds: Normal heart sounds. No murmur. No friction rub. No gallop.   Pulmonary:     Effort: Pulmonary effort is normal. No respiratory distress.     Breath sounds: Normal breath sounds. No stridor. No wheezing or rales.  Chest:     Chest wall: No tenderness.  Abdominal:     General: Bowel sounds are normal. There is no distension.     Palpations: Abdomen is soft.     Tenderness: There is no abdominal tenderness. There is no guarding or rebound.  Musculoskeletal:     Right lower leg: He exhibits no tenderness. No edema.     Left lower leg: He exhibits no tenderness. No edema.  Lymphadenopathy:     Cervical: No cervical  adenopathy.  Skin:    General: Skin is warm and dry.     Coloration: Skin is not pale.     Findings: No rash.  Neurological:     Mental Status: He is alert.     Coordination: Coordination normal.      ED Treatments / Results  Labs (all labs ordered are listed, but only abnormal results are displayed) Labs Reviewed  BASIC METABOLIC PANEL - Abnormal; Notable for the following components:      Result Value   Glucose, Bld 118 (*)    All other components within normal limits  CBC - Abnormal; Notable for the following components:   WBC 11.0 (*)    All other components within normal limits  I-STAT TROPONIN, ED  I-STAT TROPONIN, ED    EKG EKG Interpretation  Date/Time:  Friday September 05 2018 14:29:10 EST Ventricular Rate:  74 PR Interval:  116 QRS Duration: 88 QT Interval:  346 QTC Calculation: 384 R Axis:   89 Text Interpretation:  Normal sinus rhythm Normal ECG No prior ECG for comparison.  No STEMI Confirmed by Theda Belfast ([id number]) on 09/05/2018 5:26:07 PM   Radiology Dg Chest 2 View  Result Date: 09/05/2018 CLINICAL DATA:  Left-sided chest pain and numbness. EXAM: CHEST - 2 VIEW COMPARISON:  None. FINDINGS: The cardiomediastinal silhouette is within normal limits. The lungs are well inflated and clear. There is no evidence of pleural effusion or pneumothorax. No acute osseous abnormality is identified. IMPRESSION: No active cardiopulmonary disease. Electronically Signed   By: Sebastian Ache M.D.   On: 09/05/2018 15:07    Procedures Procedures (including critical care time)  Medications Ordered in ED Medications  sodium chloride flush (NS) 0.9 % injection 3 mL (has no administration in time range)     Initial Impression / Assessment and Plan / ED Course  I have reviewed the triage vital signs and the nursing notes.  Pertinent labs & imaging results that were available during my care of the patient were reviewed by me and considered in my medical decision making  (see chart for details).        Patient presenting with a 3-day history of intermittent chest pain.  Patient heart score is 3.  EKG shows NSR without any EKG to compare.  Chest x-ray is clear.  Mild elevation in white blood cell count 11.  Otherwise labs unremarkable.  Delta troponin is negative.  PERC negative, low suspicion of PE.  Patient eloped the emergency department after the second troponin was  drawn.  I was unable to speak with the patient about follow-up before he eloped, however I would have recommended patient follow-up with cardiology for further management and maybe attempt initiation of PPI. I discussed patient case with Dr. Rush Landmark who guided the patient's management and agrees with plan.   Final Clinical Impressions(s) / ED Diagnoses   Final diagnoses:  Atypical chest pain    ED Discharge Orders    None       Verdis Prime 09/05/18 1906    Tegeler, Canary Brim, MD 09/05/18 [phone]

## 2018-09-05 NOTE — ED Triage Notes (Signed)
Pt complains of left lower chest pain that radiates down his left arm x3 days.  He also reports dizziness with these episodes.  Pt was informed that we cannot do a complete rule out of MI so pt voluntarily chose to go to the ED for his own personal reassurance.  Pt was A&O and appeared to be in NAD.  Dahlia Byes, NP informed of pt's decision.

## 2018-09-05 NOTE — ED Notes (Signed)
Pt reports he is ready to leave. Pt informed we are waiting for a lab result to come back. Pt states, "I'm not waiting any longer. I'm leaving now." RN attempted to educate pt on why he should stay. Pt walked out of room and left the emergency department.

## 2019-04-25 ENCOUNTER — Ambulatory Visit (HOSPITAL_COMMUNITY)
Admission: EM | Admit: 2019-04-25 | Discharge: 2019-04-25 | Disposition: A | Discharge status: Home / Self Care | Payer: Medicaid Other | Attending: Family Medicine | Admitting: Family Medicine

## 2019-04-25 ENCOUNTER — Encounter (HOSPITAL_COMMUNITY): Payer: Self-pay

## 2019-04-25 ENCOUNTER — Other Ambulatory Visit: Payer: Self-pay

## 2019-04-25 DIAGNOSIS — B029 Zoster without complications: Secondary | ICD-10-CM | POA: Diagnosis not present

## 2019-04-25 MED ORDER — VALACYCLOVIR HCL 1 G PO TABS: 1000.0000 mg | ORAL_TABLET | Freq: Three times a day (TID) | ORAL | 0 refills | Status: AC

## 2019-04-25 NOTE — ED Provider Notes (Signed)
[place]    CSN: [id number] Arrival date & time: 04/25/19  1533      History   Chief Complaint Chief Complaint  Patient presents with  . Rash    HPI Trevor Hodges is a 38 y.o. male.   HPI Pt states he has a rash on his left shoulder. Pt states it's tender to the touch. History reviewed. No pertinent past medical history.  Patient Active Problem List   Diagnosis Date Noted  . 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 Dog bite of hand 07/31/2012    History reviewed. No pertinent surgical history.     Home Medications    Prior to Admission medications   Medication Sig Start Date End Date Taking? Authorizing Provider  ibuprofen (ADVIL,MOTRIN) 200 MG tablet Take 400 mg by mouth every 6 (six) hours as needed.    [provider]    Family History History reviewed. No pertinent family history.  Social History Social History   Tobacco Use  . Smoking status: Current Every Day Smoker    Packs/day: 1.00    Years: 20.00    Pack years: 20.00    Types: Cigarettes  . Smokeless tobacco: Never Used  Substance Use Topics  . Alcohol use: No  . Drug use: No     Allergies   Toradol [ketorolac tromethamine] and Ultram [tramadol]   Review of Systems Review of Systems   Physical Exam Triage Vital Signs ED Triage Vitals  Enc Vitals Group     BP 04/25/19 1549 (!) 130/96     Pulse Rate 04/25/19 1549 80     Resp 04/25/19 1549 18     Temp 04/25/19 1549 98.1 F (36.7 C)     Temp src --      SpO2 --      Weight 04/25/19 1547 180 lb (81.6 kg)     Height --      Head Circumference --      Peak Flow --      Pain Score --      Pain Loc --      Pain Edu? --      Excl. in [place]? --    No data found.  Updated Vital Signs BP (!) 130/96 (BP Location: Right Arm)   Pulse 80   Temp 98.1 F (36.7 C)   Resp 18   Wt 81.6 kg   BMI 25.10 kg/m    Physical Exam Vitals signs and nursing note reviewed.  Constitutional:      General: He is  not in acute distress.    Appearance: Normal appearance. He is normal weight.  Neck:     Musculoskeletal: Normal range of motion and neck supple.  Cardiovascular:     Rate and Rhythm: Normal rate.  Pulmonary:     Effort: Pulmonary effort is normal.  Musculoskeletal: Normal range of motion.  Skin:    General: Skin is warm and dry.     Findings: Erythema and rash present.  Neurological:     Mental Status: He is alert.        UC Treatments / Results  Labs (all labs ordered are listed, but only abnormal results are displayed) Labs Reviewed - No data to display  EKG   Radiology No results found.  Procedures Procedures (including critical care time)  Medications Ordered in UC Medications - No data to display  Initial Impression / Assessment and Plan / UC Course  I have reviewed the triage vital  signs and the nursing notes.  Pertinent labs & imaging results that were available during my care of the patient were reviewed by me and considered in my medical decision making (see chart for details).    Final Clinical Impressions(s) / UC Diagnoses   Final diagnoses:  None   Discharge Instructions   None    ED Prescriptions    None     I have reviewed the PDMP during this encounter.   Elvina Sidle, MD 04/25/19 1615

## 2019-04-25 NOTE — ED Triage Notes (Signed)
Pt states he has a rash on his left shoulder. Pt states it's tender to the touch.

## 2023-04-29 ENCOUNTER — Encounter (HOSPITAL_COMMUNITY): Payer: Self-pay

## 2023-04-29 ENCOUNTER — Other Ambulatory Visit: Payer: Self-pay

## 2023-04-29 ENCOUNTER — Emergency Department (HOSPITAL_COMMUNITY): Payer: Medicaid Other

## 2023-04-29 ENCOUNTER — Emergency Department (HOSPITAL_COMMUNITY)
Admission: EM | Admit: 2023-04-29 | Discharge: 2023-04-29 | Discharge status: Against Medical Advice | Payer: Medicaid Other | Attending: Emergency Medicine | Admitting: Emergency Medicine

## 2023-04-29 DIAGNOSIS — M545 Low back pain, unspecified: Secondary | ICD-10-CM | POA: Insufficient documentation

## 2023-04-29 DIAGNOSIS — S0081XA Abrasion of other part of head, initial encounter: Secondary | ICD-10-CM | POA: Diagnosis not present

## 2023-04-29 DIAGNOSIS — W11XXXA Fall on and from ladder, initial encounter: Secondary | ICD-10-CM | POA: Insufficient documentation

## 2023-04-29 DIAGNOSIS — S0990XA Unspecified injury of head, initial encounter: Secondary | ICD-10-CM | POA: Diagnosis present

## 2023-04-29 DIAGNOSIS — M542 Cervicalgia: Secondary | ICD-10-CM | POA: Insufficient documentation

## 2023-04-29 NOTE — ED Triage Notes (Addendum)
C/o working on ladder and fell off 36ft wooden ladder and hit back of head.  Pt reports LOC.  Laceration to head Patient also c/o generalized back pain and neck pain C-collar placed Ambulatory to triage.

## 2023-04-29 NOTE — ED Notes (Signed)
Wife reports pt has had a previous head injury that caused bleeding on the brain. That injury caused the pt to have issues with his equilibrium and he is non compliant with his medication because it makes him feel like he is "in a fish bowl."  Wife also feels like he may have hit the front of his head as well as to her his left brow looks swollen.

## 2023-04-29 NOTE — ED Provider Notes (Signed)
[place] EMERGENCY DEPARTMENT AT [facility] Provider Note   CSN: [id number] Arrival date & time: 04/29/23  1443     History  Chief Complaint  Patient presents with   Trevor Hodges is a 42 y.o. male no significant past medical history reports today for falling off in a folding ladder and hitting the back of his head.  Patient reported in triage loss of consciousness but only reported confusion and dizziness when provider asked.  Patient denies loss of bowel or bladder, weakness, shortness of breath, any other places of pain except for noted, nausea, vomiting.  Patient endorses pain in the lumbar region left side greater than right, pain to the back of the head, blurriness, ringing in ear, and an abrasion to the back of the head.  He has had a previous head injury which resulted in brain bleeding and does not take his medication for his equilibrium because he does not like how it makes him feel.   Fall       Home Medications Prior to Admission medications   Medication Sig Start Date End Date Taking? Authorizing Provider  ibuprofen (ADVIL,MOTRIN) 200 MG tablet Take 400 mg by mouth every 6 (six) hours as needed.    [provider]      Allergies    Ketorolac tromethamine, Tramadol, and Ketorolac    Review of Systems   Review of Systems  Musculoskeletal:  Positive for back pain and neck pain.  Psychiatric/Behavioral:  Positive for confusion.     Physical Exam Updated Vital Signs BP 105/78 (BP Location: Left Arm)   Pulse (!) 59   Temp 98 F (36.7 C) (Oral)   Resp 18   Wt 81 kg   SpO2 94%   BMI 24.91 kg/m  Physical Exam Vitals and nursing note reviewed.  Constitutional:      General: He is not in acute distress.    Appearance: He is well-developed.  HENT:     Head: Normocephalic. Abrasion present. No raccoon eyes or Battle's sign.     Jaw: No tenderness or malocclusion.     Comments: Abrasion to occiput    Right Ear: External ear  normal.     Left Ear: External ear normal.     Nose: No nasal deformity or signs of injury.     Mouth/Throat:     Mouth: Mucous membranes are moist.  Eyes:     General: No visual field deficit.    Extraocular Movements: Extraocular movements intact.     Conjunctiva/sclera: Conjunctivae normal.  Cardiovascular:     Rate and Rhythm: Normal rate and regular rhythm.     Heart sounds: No murmur heard. Pulmonary:     Effort: Pulmonary effort is normal. No respiratory distress.     Breath sounds: Normal breath sounds.  Abdominal:     Palpations: Abdomen is soft.     Tenderness: There is no abdominal tenderness.  Musculoskeletal:        General: No swelling or deformity.     Cervical back: Neck supple. Tenderness present. No swelling or deformity.     Thoracic back: No swelling or deformity.     Lumbar back: Tenderness present. No swelling or deformity.     Comments: Tenderness to the lumbar spine left greater than right  Skin:    General: Skin is warm and dry.     Capillary Refill: Capillary refill takes less than 2 seconds.     Findings: No bruising.  Neurological:     Mental Status: He is alert. He is confused.     Motor: No weakness.  Psychiatric:        Mood and Affect: Mood normal.     Comments: Patient significant other states that he seems confused.  Patient's recollection of the event seem to change when asked repeatedly.     ED Results / Procedures / Treatments   Labs (all labs ordered are listed, but only abnormal results are displayed) Labs Reviewed - No data to display  EKG None  Radiology CT Head Wo Contrast  Result Date: 04/29/2023 CLINICAL DATA:  Head trauma, abnormal mental status (Age 73-64y) EXAM: CT HEAD WITHOUT CONTRAST TECHNIQUE: Contiguous axial images were obtained from the base of the skull through the vertex without intravenous contrast. RADIATION DOSE REDUCTION: This exam was performed according to the departmental dose-optimization program which  includes automated exposure control, adjustment of the mA and/or kV according to patient size and/or use of iterative reconstruction technique. COMPARISON:  Remote head CT 11/25/2011 FINDINGS: Brain: No evidence of acute infarction, hemorrhage, hydrocephalus, extra-axial collection or mass lesion/mass effect. 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 Vascular: No hyperdense vessel or unexpected calcification. Skull: No fracture or focal lesion. Sinuses/Orbits: Mucosal thickening throughout the ethmoid air cells. Right maxillary sinus mucosal thickening only partially included in the field of view. No mastoid effusion. Other: No confluent scalp hematoma. IMPRESSION: 1. No acute intracranial abnormality. 2. 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 Electronically Signed   By: Narda Rutherford M.D.   On: 04/29/2023 19:37    Procedures Procedures    Medications Ordered in ED Medications - No data to display  ED Course/ Medical Decision Making/ A&P                                 Medical Decision Making Amount and/or Complexity of Data Reviewed Radiology: ordered.   This patient presents to the ED with chief complaint(s) of fall with pertinent past medical history of previous brain bleed which further complicates the presenting complaint. The complaint involves an extensive differential diagnosis and also carries with it a high risk of complications and morbidity.    The differential diagnosis includes concussion, cervical fracture, musculoskeletal pain  ED Course and Reassessment:  Patient eloped prior to imaging being read.  Patient got angry with his wife and left emergency department prior to myself or the attending physician speaking with him on risks of leaving AMA.  On speaking wife she states that the patient has a history of becoming angry like this.  She also states that the phone number on file is not up-to-date.  She is unsure  of a way to contact him regarding results if needed.  Independent visualization of imaging: - I independently visualized the following imaging with scope of interpretation limited to determining acute life threatening conditions related to emergency care: CT head and cervical spine, which revealed pending  Consultation: - Consulted or discussed management/test interpretation w/ external professional: None         Final Clinical Impression(s) / ED Diagnoses Final diagnoses:  None    Rx / DC Orders ED Discharge Orders     None         Chinmay, [place], PA-C 04/29/23 1942    Bethann Berkshire, MD 05/01/23 1223
